# Patient Record
Sex: Male | Born: 1975 | Race: Black or African American | Hispanic: No | Marital: Single | Smoking: Current every day smoker
Health system: Southern US, Community
[De-identification: ages and names within clinical notes are randomized; demographics above are authoritative.]

## PROBLEM LIST (undated history)

## (undated) DIAGNOSIS — G4733 Obstructive sleep apnea (adult) (pediatric): Secondary | ICD-10-CM

## (undated) DIAGNOSIS — I5022 Chronic systolic (congestive) heart failure: Secondary | ICD-10-CM

## (undated) DIAGNOSIS — Z9289 Personal history of other medical treatment: Secondary | ICD-10-CM

## (undated) DIAGNOSIS — Z91199 Patient's noncompliance with other medical treatment and regimen due to unspecified reason: Secondary | ICD-10-CM

## (undated) DIAGNOSIS — Z9119 Patient's noncompliance with other medical treatment and regimen: Secondary | ICD-10-CM

## (undated) DIAGNOSIS — C8590 Non-Hodgkin lymphoma, unspecified, unspecified site: Secondary | ICD-10-CM

## (undated) DIAGNOSIS — I428 Other cardiomyopathies: Secondary | ICD-10-CM

## (undated) DIAGNOSIS — I10 Essential (primary) hypertension: Secondary | ICD-10-CM

## (undated) DIAGNOSIS — J45909 Unspecified asthma, uncomplicated: Secondary | ICD-10-CM

## (undated) HISTORY — DX: Chronic systolic (congestive) heart failure: I50.22

## (undated) HISTORY — DX: Other cardiomyopathies: I42.8

## (undated) HISTORY — DX: Morbid (severe) obesity due to excess calories: E66.01

## (undated) HISTORY — DX: Patient's noncompliance with other medical treatment and regimen: Z91.19

## (undated) HISTORY — DX: Personal history of other medical treatment: Z92.89

## (undated) HISTORY — DX: Patient's noncompliance with other medical treatment and regimen due to unspecified reason: Z91.199

## (undated) HISTORY — PX: PORT-A-CATH REMOVAL: SHX5289

---

## 2005-12-17 ENCOUNTER — Ambulatory Visit: Payer: Self-pay | Admitting: Internal Medicine

## 2005-12-27 ENCOUNTER — Ambulatory Visit (HOSPITAL_COMMUNITY): Admission: RE | Admit: 2005-12-27 | Discharge: 2005-12-27 | Payer: Self-pay | Admitting: Internal Medicine

## 2005-12-29 ENCOUNTER — Ambulatory Visit: Payer: Self-pay | Admitting: Internal Medicine

## 2005-12-29 LAB — CONVERTED CEMR LAB: RBC count: 5.13 10*6/uL

## 2005-12-31 ENCOUNTER — Ambulatory Visit (HOSPITAL_COMMUNITY): Admission: RE | Admit: 2005-12-31 | Discharge: 2005-12-31 | Payer: Self-pay | Admitting: Internal Medicine

## 2006-12-03 ENCOUNTER — Emergency Department (HOSPITAL_COMMUNITY): Admission: EM | Admit: 2006-12-03 | Discharge: 2006-12-04 | Payer: Self-pay | Admitting: Emergency Medicine

## 2006-12-15 ENCOUNTER — Encounter: Payer: Self-pay | Admitting: Internal Medicine

## 2006-12-15 DIAGNOSIS — I10 Essential (primary) hypertension: Secondary | ICD-10-CM

## 2006-12-15 DIAGNOSIS — Z8571 Personal history of Hodgkin lymphoma: Secondary | ICD-10-CM | POA: Insufficient documentation

## 2006-12-16 ENCOUNTER — Ambulatory Visit: Payer: Self-pay | Admitting: Internal Medicine

## 2006-12-16 DIAGNOSIS — R109 Unspecified abdominal pain: Secondary | ICD-10-CM | POA: Insufficient documentation

## 2006-12-16 DIAGNOSIS — J45901 Unspecified asthma with (acute) exacerbation: Secondary | ICD-10-CM

## 2006-12-16 DIAGNOSIS — R319 Hematuria, unspecified: Secondary | ICD-10-CM | POA: Insufficient documentation

## 2006-12-16 LAB — CONVERTED CEMR LAB
Bilirubin Urine: NEGATIVE
Glucose, Urine, Semiquant: NEGATIVE
Ketones, urine, test strip: NEGATIVE
Nitrite: NEGATIVE
Specific Gravity, Urine: >=1.03
pH: 6

## 2006-12-17 ENCOUNTER — Encounter (INDEPENDENT_AMBULATORY_CARE_PROVIDER_SITE_OTHER): Payer: Self-pay | Admitting: Internal Medicine

## 2011-03-10 ENCOUNTER — Emergency Department (HOSPITAL_COMMUNITY)
Admission: EM | Admit: 2011-03-10 | Discharge: 2011-03-10 | Disposition: A | Discharge status: Home / Self Care | Payer: 59 | Attending: Emergency Medicine | Admitting: Emergency Medicine

## 2011-03-10 DIAGNOSIS — T148XXA Other injury of unspecified body region, initial encounter: Secondary | ICD-10-CM | POA: Insufficient documentation

## 2011-03-10 DIAGNOSIS — X58XXXA Exposure to other specified factors, initial encounter: Secondary | ICD-10-CM | POA: Insufficient documentation

## 2011-03-10 DIAGNOSIS — I10 Essential (primary) hypertension: Secondary | ICD-10-CM | POA: Insufficient documentation

## 2011-03-10 DIAGNOSIS — Y9367 Activity, basketball: Secondary | ICD-10-CM | POA: Insufficient documentation

## 2011-08-13 ENCOUNTER — Emergency Department (HOSPITAL_COMMUNITY)
Admission: EM | Admit: 2011-08-13 | Discharge: 2011-08-14 | Disposition: A | Discharge status: Home / Self Care | Payer: 59 | Attending: Emergency Medicine | Admitting: Emergency Medicine

## 2011-08-13 DIAGNOSIS — I10 Essential (primary) hypertension: Secondary | ICD-10-CM | POA: Insufficient documentation

## 2011-08-13 DIAGNOSIS — G510 Bell's palsy: Secondary | ICD-10-CM | POA: Insufficient documentation

## 2011-08-14 ENCOUNTER — Emergency Department (HOSPITAL_COMMUNITY): Payer: 59

## 2011-08-14 LAB — BASIC METABOLIC PANEL: Calcium: 10.1 mg/dL (ref 8.4–10.5)

## 2013-03-15 ENCOUNTER — Inpatient Hospital Stay (HOSPITAL_COMMUNITY)
Admission: EM | Admit: 2013-03-15 | Discharge: 2013-03-20 | DRG: 544 | Disposition: A | Discharge status: Home / Self Care | Payer: BC Managed Care – PPO | Attending: Family Medicine | Admitting: Family Medicine

## 2013-03-15 ENCOUNTER — Emergency Department (HOSPITAL_COMMUNITY): Payer: BC Managed Care – PPO

## 2013-03-15 ENCOUNTER — Encounter (HOSPITAL_COMMUNITY): Payer: Self-pay | Admitting: Emergency Medicine

## 2013-03-15 DIAGNOSIS — J9600 Acute respiratory failure, unspecified whether with hypoxia or hypercapnia: Secondary | ICD-10-CM

## 2013-03-15 DIAGNOSIS — I5021 Acute systolic (congestive) heart failure: Secondary | ICD-10-CM

## 2013-03-15 DIAGNOSIS — I110 Hypertensive heart disease with heart failure: Principal | ICD-10-CM | POA: Diagnosis present

## 2013-03-15 DIAGNOSIS — N289 Disorder of kidney and ureter, unspecified: Secondary | ICD-10-CM | POA: Diagnosis present

## 2013-03-15 DIAGNOSIS — F17200 Nicotine dependence, unspecified, uncomplicated: Secondary | ICD-10-CM | POA: Diagnosis present

## 2013-03-15 DIAGNOSIS — I5031 Acute diastolic (congestive) heart failure: Secondary | ICD-10-CM

## 2013-03-15 DIAGNOSIS — J45901 Unspecified asthma with (acute) exacerbation: Secondary | ICD-10-CM

## 2013-03-15 DIAGNOSIS — I5043 Acute on chronic combined systolic (congestive) and diastolic (congestive) heart failure: Secondary | ICD-10-CM | POA: Diagnosis present

## 2013-03-15 DIAGNOSIS — G4733 Obstructive sleep apnea (adult) (pediatric): Secondary | ICD-10-CM | POA: Diagnosis present

## 2013-03-15 DIAGNOSIS — Z6841 Body Mass Index (BMI) 40.0 and over, adult: Secondary | ICD-10-CM

## 2013-03-15 DIAGNOSIS — R0902 Hypoxemia: Secondary | ICD-10-CM | POA: Diagnosis present

## 2013-03-15 DIAGNOSIS — I10 Essential (primary) hypertension: Secondary | ICD-10-CM | POA: Diagnosis present

## 2013-03-15 DIAGNOSIS — I509 Heart failure, unspecified: Secondary | ICD-10-CM | POA: Diagnosis present

## 2013-03-15 HISTORY — DX: Obstructive sleep apnea (adult) (pediatric): G47.33

## 2013-03-15 HISTORY — DX: Unspecified asthma, uncomplicated: J45.909

## 2013-03-15 HISTORY — DX: Essential (primary) hypertension: I10

## 2013-03-15 HISTORY — DX: Non-Hodgkin lymphoma, unspecified, unspecified site: C85.90

## 2013-03-15 LAB — CBC
Hemoglobin: 14.1 g/dL (ref 13.0–17.0)
MCH: 28.4 pg (ref 26.0–34.0)
MCV: 85.1 fL (ref 78.0–100.0)
Platelets: 294 10*3/uL (ref 150–400)
RDW: 14.1 % (ref 11.5–15.5)
WBC: 7.7 10*3/uL (ref 4.0–10.5)

## 2013-03-15 LAB — COMPREHENSIVE METABOLIC PANEL
AST: 20 U/L (ref 0–37)
BUN: 23 mg/dL (ref 6–23)
CO2: 29 mEq/L (ref 19–32)
Calcium: 9.5 mg/dL (ref 8.4–10.5)
Creatinine, Ser: 1.39 mg/dL — ABNORMAL HIGH (ref 0.50–1.35)
GFR calc Af Amer: 74 mL/min — ABNORMAL LOW (ref >90)
GFR calc non Af Amer: 64 mL/min — ABNORMAL LOW (ref >90)
Glucose, Bld: 96 mg/dL (ref 70–99)

## 2013-03-15 LAB — CBC WITH DIFFERENTIAL/PLATELET
Basophils Absolute: 0 10*3/uL (ref 0.0–0.1)
Lymphocytes Relative: 19 % (ref 12–46)
Neutro Abs: 6.3 10*3/uL (ref 1.7–7.7)
Platelets: 296 10*3/uL (ref 150–400)
RDW: 14.2 % (ref 11.5–15.5)
WBC: 8.7 10*3/uL (ref 4.0–10.5)

## 2013-03-15 LAB — PROTIME-INR: INR: 0.97 (ref 0.00–1.49)

## 2013-03-15 LAB — TSH: TSH: 2.122 u[IU]/mL (ref 0.350–4.500)

## 2013-03-15 LAB — POCT I-STAT TROPONIN I: Troponin i, poc: 0.02 ng/mL (ref 0.00–0.08)

## 2013-03-15 LAB — APTT: aPTT: 38 seconds — ABNORMAL HIGH (ref 24–37)

## 2013-03-15 LAB — BASIC METABOLIC PANEL
CO2: 24 mEq/L (ref 19–32)
Sodium: 142 mEq/L (ref 135–145)

## 2013-03-15 LAB — MAGNESIUM: Magnesium: 1.8 mg/dL (ref 1.5–2.5)

## 2013-03-15 LAB — PHOSPHORUS: Phosphorus: 3.9 mg/dL (ref 2.3–4.6)

## 2013-03-15 LAB — RAPID URINE DRUG SCREEN, HOSP PERFORMED
Amphetamines: NOT DETECTED
Benzodiazepines: NOT DETECTED
Opiates: NOT DETECTED
Tetrahydrocannabinol: POSITIVE — AB

## 2013-03-15 LAB — PRO B NATRIURETIC PEPTIDE: Pro B Natriuretic peptide (BNP): 3906 pg/mL — ABNORMAL HIGH (ref 0–125)

## 2013-03-15 LAB — ETHANOL: Alcohol, Ethyl (B): <11 mg/dL (ref 0–11)

## 2013-03-15 MED ORDER — MORPHINE SULFATE 2 MG/ML IJ SOLN: 1.0000 mg | INTRAMUSCULAR | Status: DC | PRN

## 2013-03-15 MED ORDER — ACETAMINOPHEN 650 MG RE SUPP: 650.0000 mg | Freq: Four times a day (QID) | RECTAL | Status: DC | PRN

## 2013-03-15 MED ORDER — ONDANSETRON HCL 4 MG PO TABS: 4.0000 mg | ORAL_TABLET | Freq: Four times a day (QID) | ORAL | Status: DC | PRN

## 2013-03-15 MED ORDER — ALBUTEROL SULFATE (5 MG/ML) 0.5% IN NEBU
2.5000 mg | INHALATION_SOLUTION | RESPIRATORY_TRACT | Status: DC
  Administered 2013-03-15 – 2013-03-16 (×5): 2.5 mg via RESPIRATORY_TRACT
  Filled 2013-03-15 (×6): qty 0.5

## 2013-03-15 MED ORDER — FUROSEMIDE 10 MG/ML IJ SOLN
40.0000 mg | Freq: Two times a day (BID) | INTRAMUSCULAR | Status: DC
  Administered 2013-03-15 – 2013-03-17 (×5): 40 mg via INTRAVENOUS
  Filled 2013-03-15 (×9): qty 4

## 2013-03-15 MED ORDER — SODIUM CHLORIDE 0.9 % IJ SOLN
3.0000 mL | Freq: Two times a day (BID) | INTRAMUSCULAR | Status: DC
  Administered 2013-03-15 – 2013-03-20 (×11): 3 mL via INTRAVENOUS

## 2013-03-15 MED ORDER — ACETAMINOPHEN 325 MG PO TABS: 650.0000 mg | ORAL_TABLET | Freq: Four times a day (QID) | ORAL | Status: DC | PRN

## 2013-03-15 MED ORDER — HYDROCODONE-ACETAMINOPHEN 5-325 MG PO TABS: 1.0000 | ORAL_TABLET | ORAL | Status: DC | PRN

## 2013-03-15 MED ORDER — ALBUTEROL SULFATE (5 MG/ML) 0.5% IN NEBU
2.5000 mg | INHALATION_SOLUTION | RESPIRATORY_TRACT | Status: DC | PRN
  Filled 2013-03-15: qty 0.5

## 2013-03-15 MED ORDER — FLUTICASONE PROPIONATE HFA 44 MCG/ACT IN AERO
1.0000 | INHALATION_SPRAY | Freq: Two times a day (BID) | RESPIRATORY_TRACT | Status: DC
  Administered 2013-03-15 – 2013-03-20 (×10): 1 via RESPIRATORY_TRACT
  Filled 2013-03-15: qty 10.6

## 2013-03-15 MED ORDER — METHYLPREDNISOLONE SODIUM SUCC 125 MG IJ SOLR
80.0000 mg | Freq: Four times a day (QID) | INTRAMUSCULAR | Status: DC
  Administered 2013-03-15 – 2013-03-16 (×4): 80 mg via INTRAVENOUS
  Filled 2013-03-15 (×7): qty 1.28

## 2013-03-15 MED ORDER — ONDANSETRON HCL 4 MG/2ML IJ SOLN: 4.0000 mg | Freq: Four times a day (QID) | INTRAMUSCULAR | Status: DC | PRN

## 2013-03-15 MED ORDER — FUROSEMIDE 10 MG/ML IJ SOLN
60.0000 mg | Freq: Once | INTRAMUSCULAR | Status: AC
  Administered 2013-03-15: 60 mg via INTRAVENOUS
  Filled 2013-03-15: qty 8

## 2013-03-15 MED ORDER — IPRATROPIUM BROMIDE 0.02 % IN SOLN
0.5000 mg | RESPIRATORY_TRACT | Status: DC | PRN
  Filled 2013-03-15: qty 2.5

## 2013-03-15 MED ORDER — AMLODIPINE BESYLATE 5 MG PO TABS
5.0000 mg | ORAL_TABLET | Freq: Every day | ORAL | Status: DC
  Administered 2013-03-15 – 2013-03-16 (×2): 5 mg via ORAL
  Filled 2013-03-15 (×2): qty 1

## 2013-03-15 MED ORDER — SODIUM CHLORIDE 0.9 % IV SOLN
Freq: Once | INTRAVENOUS | Status: AC
  Administered 2013-03-15: 13:00:00 via INTRAVENOUS

## 2013-03-15 MED ORDER — IPRATROPIUM BROMIDE 0.02 % IN SOLN
0.5000 mg | RESPIRATORY_TRACT | Status: DC
  Administered 2013-03-15 – 2013-03-16 (×5): 0.5 mg via RESPIRATORY_TRACT
  Filled 2013-03-15 (×6): qty 2.5

## 2013-03-15 MED ORDER — PNEUMOCOCCAL VAC POLYVALENT 25 MCG/0.5ML IJ INJ
0.5000 mL | INJECTION | Freq: Once | INTRAMUSCULAR | Status: DC
  Filled 2013-03-15: qty 0.5

## 2013-03-15 MED ORDER — ZOLPIDEM TARTRATE 5 MG PO TABS
5.0000 mg | ORAL_TABLET | Freq: Once | ORAL | Status: AC
  Administered 2013-03-15: 5 mg via ORAL
  Filled 2013-03-15: qty 1

## 2013-03-15 NOTE — ED Notes (Addendum)
Pt c/o generalized weakness, increased shortness of breath, and swelling in his lower extremities. Pt reports this has been increasing over the past two months, however did not obtain treatment due to lack of healthcare insurance. Pt reports that he has been unable to perform his usual activities and becomes short of breath while walking short distances, going up stairs, and playing golf. Pt reports that he normally can play a round of golf, play basketball, and other activities, however he has been too short of breath to even walk. Pt reports intermittent chest pain, however denies at this time. Pt is concerned about his history of lymphoma and the possibility of reoccurrence.

## 2013-03-15 NOTE — ED Notes (Signed)
ZOX:[id number]<AV> Expected date:<BR> Expected time:<BR> Means of arrival:<BR> Comments:<BR> Triage-fatigue

## 2013-03-15 NOTE — H&P (Signed)
[facility] History and Physical  Brian Hale HKV:[id number] DOB: 1976-06-22 DOA: 03/15/2013  Referring physician: ER physician PCP: METZ,CHRISTINE   Chief Complaint: shortness of breath   HPI:  37 year old male with past medical history of morbid obesity, hypertension and well-controlled asthma who presented to [facility] ED with worsening shortness of breath for past few days prior to this admission. Patient reported having shortness of breath especially on exertion for the past month and he thought this was asthma exacerbation. He did not try taking rescue inhalers and thought that his symptoms would just resolve spontaneously. Now he comes to ED for evaluation as he could not ambulate  even 10 feet without getting severe shortness of breath. Patient does report occasional cough but what he describes "coughing only to get more air in". No complaints of chest pain, palpitations. No fevers or chills. No abdominal pain, nausea or vomiting. No lightheadedness or loss of consciousness. In ED, patient was saturating 97% while on 2 L nasal cannula. His blood pressure was 138/104. Chest x-ray was significant for cardiomegaly. CBC and BMP were essentially unremarkable. BNP was elevated at 3906. No previous values for comparison. Patient was given Lasix 60 mg IV times once in ED but he remains short of breath.  Assessment and plan:  Principal Problem:   Acute respiratory failure with hypoxia - Likely secondary to acute asthma exacerbation versus acute diastolic CHF exacerbation - We have started nebulizer treatments, albuterol and Atrovent every 4 hours as scheduled and every 2 hours as needed - We started Solu-Medrol 80 mg every 6 hours IV - Started Lasix 40 mg IV twice a day for possible CHF. Followup 2-D echo; cycle cardiac enzymes Active Problems:   Asthma with acute exacerbation - Management as above with nebulizer treatments as well as steroids   Acute diastolic CHF (congestive heart  failure) - As mentioned above, BNP was elevated at 3906 and there is no other value for comparison. - Lasix 60 mg IV times once was given in ED. We will continue Lasix 40 mg IV twice a day and followup on 2D echo - Cycle cardiac enzymes   OBESITY, MORBID - Nutrition consulted   HYPERTENSION - Restarted home Norvasc. Continue to monitor blood pressure. Patient may need another antihypertensive if blood pressure does not improve in next 12 hours.  Code Status: Full Family Communication: Pt at bedside Disposition Plan: Admit for further evaluation  Manson Passey, MD  [facility] Pager [phone]  If 7PM-7AM, please contact night-coverage [email or web address] Password [email or web address] 03/15/2013, 4:08 PM  Review of Systems:  Constitutional: Negative for fever, chills and malaise/fatigue. Negative for diaphoresis.  HENT: Negative for hearing loss, ear pain, nosebleeds, congestion, sore throat, neck pain, tinnitus and ear discharge.   Eyes: Negative for blurred vision, double vision, photophobia, pain, discharge and redness.  Respiratory: Positive for shortness of breath, intermittent cough; positive for wheezing but no stridor   Cardiovascular: Negative for chest pain, palpitations, orthopnea, claudication and leg swelling.  Gastrointestinal: Negative for nausea, vomiting and abdominal pain. Negative for heartburn, constipation, blood in stool and melena.  Genitourinary: Negative for dysuria, urgency, frequency, hematuria and flank pain.  Musculoskeletal: Negative for myalgias, back pain, joint pain and falls.  Skin: Negative for itching and rash.  Neurological: Negative for dizziness and weakness. Negative for tingling, tremors, sensory change, speech change, focal weakness, loss of consciousness and headaches.  Endo/Heme/Allergies: Negative for environmental allergies and polydipsia. Does not bruise/bleed easily.  Psychiatric/Behavioral: Negative for suicidal ideas. The patient is not nervous/anxious.  Past  Medical History  Diagnosis Date  . Lymphoma in remission   . Asthma    Past Surgical History  Procedure Laterality Date  . Port-a-cath removal     Social History:  reports that he quit smoking about 3 weeks ago. His smoking use included Cigars. He has never used smokeless tobacco. He reports that he drinks about 21.6 ounces of alcohol per week. He reports that he does not use illicit drugs.  No Known Allergies  Family History: asthma in parents and son  Prior to Admission medications   Medication Sig Start Date End Date Taking? Authorizing Provider  amLODipine (NORVASC) 5 MG tablet Take 5 mg by mouth daily.   Yes Historical Provider, MD  beclomethasone (QVAR) 40 MCG/ACT inhaler Inhale 3 puffs into the lungs as needed (for shortness of breath).   Yes Historical Provider, MD   Physical Exam: Filed Vitals:   03/15/13 1333 03/15/13 1336 03/15/13 1430 03/15/13 1456  BP: 151/110 148/107 138/104 147/111  Pulse: 98 99  102  Temp:    97.8 F (36.6 C)  TempSrc:    Oral  Resp: 20 24 21 20   SpO2: 97% 98%  100%    Physical Exam  Constitutional: Appears well-developed and well-nourished. No distress.  HENT: Normocephalic. External right and left ear normal. Oropharynx is clear and moist.  Eyes: Conjunctivae and EOM are normal. PERRLA, no scleral icterus.  Neck: Normal ROM. Neck supple. No JVD. No tracheal deviation. No thyromegaly.  CVS: RRR, S1/S2 +, no murmurs, no gallops, no carotid bruit.  Pulmonary: Profound wheezing, audible. No crackles. Abdominal: Soft. BS +,  no distension, tenderness, rebound or guarding.  Musculoskeletal: Normal range of motion. No edema and no tenderness.  Lymphadenopathy: No lymphadenopathy noted, cervical, inguinal. Neuro: Alert. Normal reflexes, muscle tone coordination. No cranial nerve deficit. Skin: Skin is warm and dry. No rash noted. Not diaphoretic. No erythema. No pallor.  Psychiatric: Normal mood and affect. Behavior, judgment, thought content  normal.   Labs on Admission:  Basic Metabolic Panel:  Recent Labs Lab 03/15/13 1145  NA 142  K 3.8  CL 107  CO2 24  GLUCOSE 107*  BUN 23  CREATININE 1.33  CALCIUM 9.4   CBC:  Recent Labs Lab 03/15/13 1145 03/15/13 1552  WBC 7.7 8.7  NEUTROABS  --  6.3  HGB 14.1 14.2  HCT 42.2 43.5  MCV 85.1 85.0  PLT 294 296   Radiological Exams on Admission: Dg Chest 2 View 03/15/2013   * IMPRESSION: Cardiac enlargement with mild edema compatible with fluid overload.   Original Report Authenticated By: Janeece Riggers, M.D.    EKG: Normal sinus rhythm, no ST/T wave changes

## 2013-03-15 NOTE — ED Notes (Signed)
Patient returned from X-ray 

## 2013-03-15 NOTE — ED Provider Notes (Signed)
History     CSN: [id number]  Arrival date & time 03/15/13  1108   First MD Initiated Contact with Patient 03/15/13 1135      Chief Complaint  Patient presents with  . Shortness of Breath  . Fatigue    (Consider location/radiation/quality/duration/timing/severity/associated sxs/prior treatment) Patient is a 37 y.o. male presenting with shortness of breath. The history is provided by the patient.  Shortness of Breath  patient here complaining of a one-month history of orthopnea and dyspnea on exertion. Denies any anginal type chest pain. Some nonproductive cough. Increased sleep apnea at home. Denies any vomiting or diarrhea. Symptoms have been persistent and has been self-medicating with a diuretic which was not prescribed for him. He does have a history of hypertension has been taking his Norvasc as directed. Percent at this time due 2 worsening dyspnea.  Past Medical History  Diagnosis Date  . Lymphoma in remission   . Asthma     Past Surgical History  Procedure Laterality Date  . Port-a-cath removal      No family history on file.  History  Substance Use Topics  . Smoking status: Former Smoker -- 0.10 packs/day for 20 years    Types: Cigars    Quit date: 02/22/2013  . Smokeless tobacco: Never Used  . Alcohol Use: 21.6 oz/week    36 Cans of beer per week      Review of Systems  Respiratory: Positive for shortness of breath.   All other systems reviewed and are negative.    Allergies  Review of patient's allergies indicates no known allergies.  Home Medications   Current Outpatient Rx  Name  Route  Sig  Dispense  Refill  . amLODipine (NORVASC) 5 MG tablet   Oral   Take 5 mg by mouth daily.         . beclomethasone (QVAR) 40 MCG/ACT inhaler   Inhalation   Inhale 3 puffs into the lungs as needed (for shortness of breath).           BP 160/116  Pulse 107  Temp(Src) 97.7 F (36.5 C) (Oral)  Resp 22  SpO2 98%  Physical Exam  Nursing note and  vitals reviewed. Constitutional: He is oriented to person, place, and time. He appears well-developed and well-nourished.  Non-toxic appearance. No distress.  HENT:  Head: Normocephalic and atraumatic.  Eyes: Conjunctivae, EOM and lids are normal. Pupils are equal, round, and reactive to light.  Neck: Normal range of motion. Neck supple. No tracheal deviation present. No mass present.  Cardiovascular: Regular rhythm and normal heart sounds.  Tachycardia present.  Exam reveals no gallop.   No murmur heard. Pulmonary/Chest: Effort normal. No stridor. No respiratory distress. He has decreased breath sounds. He has no wheezes. He has rhonchi. He has no rales.  Abdominal: Soft. Normal appearance and bowel sounds are normal. He exhibits no distension. There is no tenderness. There is no rebound and no CVA tenderness.  Musculoskeletal: Normal range of motion. He exhibits no edema and no tenderness.  3+ bilateral lower extremity pitting edema  Neurological: He is alert and oriented to person, place, and time. He has normal strength. No cranial nerve deficit or sensory deficit. GCS eye subscore is 4. GCS verbal subscore is 5. GCS motor subscore is 6.  Skin: Skin is warm and dry. No abrasion and no rash noted.  Psychiatric: He has a normal mood and affect. His speech is normal and behavior is normal.    ED Course  Procedures (including critical care time)  Labs Reviewed  CBC  BASIC METABOLIC PANEL  PRO B NATRIURETIC PEPTIDE   No results found.   No diagnosis found.    MDM   Date: 03/15/2013  Rate: 107  Rhythm: sinus tachycardia  QRS Axis: normal  Intervals: normal  ST/T Wave abnormalities: nonspecific ST changes  Conduction Disutrbances:none  Narrative Interpretation:   Old EKG Reviewed: none available  1:54 PM Patient given Lasix IV push and will be admitted for evaluation of CHF        Toy Baker, MD 03/15/13 1355

## 2013-03-15 NOTE — ED Notes (Signed)
Patient transported to X-ray 

## 2013-03-16 DIAGNOSIS — I5031 Acute diastolic (congestive) heart failure: Secondary | ICD-10-CM

## 2013-03-16 DIAGNOSIS — I517 Cardiomegaly: Secondary | ICD-10-CM

## 2013-03-16 DIAGNOSIS — I509 Heart failure, unspecified: Secondary | ICD-10-CM

## 2013-03-16 DIAGNOSIS — J45901 Unspecified asthma with (acute) exacerbation: Secondary | ICD-10-CM

## 2013-03-16 DIAGNOSIS — J9600 Acute respiratory failure, unspecified whether with hypoxia or hypercapnia: Secondary | ICD-10-CM

## 2013-03-16 LAB — CBC
HCT: 45.2 % (ref 39.0–52.0)
Hemoglobin: 14.4 g/dL (ref 13.0–17.0)
MCHC: 31.9 g/dL (ref 30.0–36.0)
RBC: 5.25 MIL/uL (ref 4.22–5.81)

## 2013-03-16 LAB — COMPREHENSIVE METABOLIC PANEL
ALT: 32 U/L (ref 0–53)
Calcium: 10 mg/dL (ref 8.4–10.5)
Creatinine, Ser: 1.37 mg/dL — ABNORMAL HIGH (ref 0.50–1.35)
GFR calc Af Amer: 75 mL/min — ABNORMAL LOW (ref >90)
Glucose, Bld: 256 mg/dL — ABNORMAL HIGH (ref 70–99)
Sodium: 142 mEq/L (ref 135–145)
Total Protein: 6.8 g/dL (ref 6.0–8.3)

## 2013-03-16 LAB — BASIC METABOLIC PANEL
CO2: 30 mEq/L (ref 19–32)
Calcium: 10.7 mg/dL — ABNORMAL HIGH (ref 8.4–10.5)
Chloride: 98 mEq/L (ref 96–112)
Glucose, Bld: 203 mg/dL — ABNORMAL HIGH (ref 70–99)
Potassium: 4.3 mEq/L (ref 3.5–5.1)
Sodium: 139 mEq/L (ref 135–145)

## 2013-03-16 LAB — GLUCOSE, CAPILLARY: Glucose-Capillary: 144 mg/dL — ABNORMAL HIGH (ref 70–99)

## 2013-03-16 MED ORDER — ALBUTEROL SULFATE (5 MG/ML) 0.5% IN NEBU
2.5000 mg | INHALATION_SOLUTION | Freq: Four times a day (QID) | RESPIRATORY_TRACT | Status: DC
  Administered 2013-03-17 – 2013-03-20 (×12): 2.5 mg via RESPIRATORY_TRACT
  Filled 2013-03-16 (×11): qty 0.5

## 2013-03-16 MED ORDER — HYDROCHLOROTHIAZIDE 25 MG PO TABS
25.0000 mg | ORAL_TABLET | Freq: Every day | ORAL | Status: DC
  Administered 2013-03-16 – 2013-03-17 (×2): 25 mg via ORAL
  Filled 2013-03-16 (×2): qty 1

## 2013-03-16 MED ORDER — METHYLPREDNISOLONE SODIUM SUCC 125 MG IJ SOLR
60.0000 mg | Freq: Every day | INTRAMUSCULAR | Status: DC
  Administered 2013-03-17: 60 mg via INTRAVENOUS
  Filled 2013-03-16: qty 0.96

## 2013-03-16 MED ORDER — HYDRALAZINE HCL 20 MG/ML IJ SOLN
10.0000 mg | Freq: Four times a day (QID) | INTRAMUSCULAR | Status: DC | PRN
  Administered 2013-03-17: 10 mg via INTRAVENOUS
  Filled 2013-03-16: qty 1

## 2013-03-16 MED ORDER — AMLODIPINE BESYLATE 10 MG PO TABS
10.0000 mg | ORAL_TABLET | Freq: Every day | ORAL | Status: DC
  Administered 2013-03-16: 5 mg via ORAL
  Administered 2013-03-17 – 2013-03-20 (×4): 10 mg via ORAL
  Filled 2013-03-16 (×5): qty 1

## 2013-03-16 MED ORDER — AMLODIPINE BESYLATE 10 MG PO TABS: 10.0000 mg | ORAL_TABLET | Freq: Every day | ORAL | Status: DC

## 2013-03-16 MED ORDER — IPRATROPIUM BROMIDE 0.02 % IN SOLN
0.5000 mg | Freq: Four times a day (QID) | RESPIRATORY_TRACT | Status: DC
  Administered 2013-03-17 – 2013-03-20 (×12): 0.5 mg via RESPIRATORY_TRACT
  Filled 2013-03-16 (×10): qty 2.5

## 2013-03-16 MED ORDER — HYDROCHLOROTHIAZIDE 25 MG PO TABS
25.0000 mg | ORAL_TABLET | Freq: Every day | ORAL | Status: DC
  Filled 2013-03-16: qty 1

## 2013-03-16 NOTE — Progress Notes (Signed)
Utilization Review completed.  Amadi Frady RN CM  

## 2013-03-16 NOTE — Progress Notes (Signed)
  Echocardiogram 2D Echocardiogram has been performed.  Brian Hale 03/16/2013, 10:44 AM

## 2013-03-16 NOTE — Evaluation (Signed)
Physical Therapy Evaluation Patient Details Name: Brian Hale MRN: [id number] DOB: 1976-04-03 Today's Date: 03/16/2013 Time: 8413-2440 PT Time Calculation (min): 23 min  PT Assessment / Plan / Recommendation Clinical Impression  37 yo male admitted 03/15/13 for increased SOB, h/o asthma. Pt is independent with mobility. Ambulated in hall  x 300 ' with 2 l O2 sats remained Z> 90. amb. x 150 ; on RA  sats dropped to 86 eventually. Returned to 96% on 2 l and pursed lip breaths.RN aware.Pt does not require skilled PT at this time. Nursing can determined Home O2 needs. Pt can ambulate with family with O2 per RN ok.     PT Assessment  Patent does not need any further PT services    Follow Up Recommendations       Does the patient have the potential to tolerate intense rehabilitation      Barriers to Discharge        Equipment Recommendations       Recommendations for Other Services     Frequency      Precautions / Restrictions Precautions Precaution Comments: oxygen   Pertinent Vitals/Pain       Mobility  Bed Mobility Bed Mobility: Supine to Sit Supine to Sit: 7: Independent Transfers Transfers: Sit to Stand;Stand to Sit Sit to Stand: 7: Independent Stand to Sit: 7: Independent Ambulation/Gait Ambulation/Gait Assistance: 7: Independent Ambulation Distance (Feet): 400 Feet (O2 tank taken by PT) Assistive device: None    Exercises Other Exercises Other Exercises: pursed lip breaths.   PT Diagnosis:    PT Problem List:   PT Treatment Interventions:     PT Goals    Visit Information  Last PT Received On: 03/16/13 Assistance Needed: +1    Subjective Data  Subjective: Will i have to have oxygen when I work. Patient Stated Goal: to go home.   Prior Functioning  Home Living Lives With: Spouse Available Help at Discharge: Family Type of Home: House Home Layout: One level Home Adaptive Equipment: None Prior Function Level of Independence:  Independent Vocation: Full time employment Communication Communication: No difficulties    Cognition  Cognition Arousal/Alertness: Awake/alert Behavior During Therapy: WFL for tasks assessed/performed Overall Cognitive Status: Within Functional Limits for tasks assessed    Extremity/Trunk Assessment Right Upper Extremity Assessment RUE ROM/Strength/Tone: Within functional levels Left Upper Extremity Assessment LUE ROM/Strength/Tone: Within functional levels Right Lower Extremity Assessment RLE ROM/Strength/Tone: Within functional levels Left Lower Extremity Assessment LLE ROM/Strength/Tone: Within functional levels   Balance    End of Session PT - End of Session Activity Tolerance: Patient tolerated treatment well Patient left: with family/visitor present Nurse Communication: Mobility status (sats)  GP     Rada Hay 03/16/2013, 2:28 PM  Blanchard Kelch PT [phone]

## 2013-03-16 NOTE — Progress Notes (Signed)
Pt stated he would like med to help sleep. Midlevel paged awaiting orders.

## 2013-03-16 NOTE — Progress Notes (Signed)
  RD consulted for nutrition education regarding weight loss.  Body mass index is 42.96 kg/(m^2). Pt meets criteria for Morbid Obesity based on current BMI.  RD provided "Weight Loss Tips" handout from the Academy of [employer] Dietetics and "MyPlate" poster from [employer]. Emphasized the importance of serving sizes and provided examples of correct portions of common foods. Discussed importance of controlled and consistent intake throughout the day. Provided examples of ways to balance meals/snacks and encouraged intake of high-fiber, whole grain complex carbohydrates. Emphasized the importance of hydration with calorie-free beverages and limiting sugar-sweetened beverages. Encouraged pt to discuss physical activity options with physician. Teach back method used. Pt reports planning on eating out less, eating more fish and vegetables, decreasing intake of juice and sweet tea, and increasing physical activity.  Expect good compliance.  Current diet order is Heart Healthy, patient is consuming approximately 100% of meals at this time. Labs and medications reviewed. No further nutrition interventions warranted at this time. RD contact information provided. If additional nutrition issues arise, please re-consult RD.  Ian Malkin RD, LDN Inpatient Clinical Dietitian Pager: [phone] After Hours Pager: [phone]

## 2013-03-16 NOTE — Progress Notes (Signed)
[facility] PROGRESS NOTE  Brian Hale JYN:[id number] DOB: September 02, 1976 DOA: 03/15/2013 PCP: METZ,CHRISTINE  Assessment/Plan: Acute respiratory failure with hypoxia  - Likely secondary to acute asthma exacerbation versus acute diastolic CHF exacerbation (echo pending) - We have started nebulizer treatments, albuterol and Atrovent every 4 hours as scheduled and every 2 hours as needed. Patient reports history of asthma as child. - Will change solumedrol to 60 mg IV q day - Started Lasix 40 mg IV twice a day for possible CHF. Followup 2-D echo; cardiac enzymes x 3 negative  Active Problems:  Asthma with acute exacerbation  - Continue management as above with nebulizer treatments as well as steroids  Suspect Acute diastolic CHF (congestive heart failure)  - BNP was elevated at 3906 and there is no other value for comparison.  - Continue Lasix 40 mg IV twice a day and followup on 2D echo  - Cycle cardiac enzymes  - Continue to monitor I/O's net loss of > 1 L  OBESITY, MORBID  - Nutrition consulted and they have educated patient.  HYPERTENSION  - Not well controlled - placed order for hydralazine prn - increased norvasc to 10 mg po qd, also added hctz - recheck bmp next am.   Code Status: full Family Communication: Discussed with patient and spouse at bedside. Disposition Plan: Pending results of echocardiogram and continued improvement in respiratory rate.  Consultants:  None  Procedures:  2 D echocardiogram  Antibiotics:  None  HPI/Subjective: No acute issues overnight. No new complaints.   Objective: Filed Vitals:   03/16/13 0500 03/16/13 0542 03/16/13 1019 03/16/13 1353  BP:  172/95 179/110 191/113  Pulse:  88  106  Temp:  98 F (36.7 C)  98.5 F (36.9 C)  TempSrc:  Oral  Oral  Resp:  18  18  Height:      Weight: 143.8 kg (317 lb 0.3 oz) 143.7 kg (316 lb 12.8 oz)    SpO2:  96%  92%    Intake/Output Summary (Last 24 hours) at 03/16/13 1407 Last  data filed at 03/16/13 1354  Gross per 24 hour  Intake    840 ml  Output   1775 ml  Net   -935 ml   Filed Weights   03/15/13 1840 03/16/13 0500 03/16/13 0542  Weight: 141.432 kg (311 lb 12.8 oz) 143.8 kg (317 lb 0.3 oz) 143.7 kg (316 lb 12.8 oz)    Exam:   General:  Pt in NAD, Alert and Awake  Cardiovascular: RRR, No MRG  Respiratory: No wheezes, decreased breath sound at Right base when compared to left  Abdomen: soft, Nt, ND  Musculoskeletal: no cyanosis or clubbing   Data Reviewed: Basic Metabolic Panel:  Recent Labs Lab 03/15/13 1145 03/15/13 1552 03/16/13 0321  NA 142 143 142  K 3.8 3.8 3.9  CL 107 104 101  CO2 24 29 30   GLUCOSE 107* 96 256*  BUN 23 23 25*  CREATININE 1.33 1.39* 1.37*  CALCIUM 9.4 9.5 10.0  MG  --  1.8  --   PHOS  --  3.9  --    Liver Function Tests:  Recent Labs Lab 03/15/13 1552 03/16/13 0321  AST 20 19  ALT 35 32  ALKPHOS 97 103  BILITOT 0.7 0.6  PROT 6.7 6.8  ALBUMIN 3.7 3.5   No results found for this basename: LIPASE, AMYLASE,  in the last 168 hours No results found for this basename: AMMONIA,  in the last 168 hours CBC:  Recent Labs Lab 03/15/13 1145 03/15/13 1552 03/16/13 0321  WBC 7.7 8.7 9.3  NEUTROABS  --  6.3  --   HGB 14.1 14.2 14.4  HCT 42.2 43.5 45.2  MCV 85.1 85.0 86.1  PLT 294 296 317   Cardiac Enzymes:  Recent Labs Lab 03/15/13 1552 03/15/13 2114 03/16/13 0321  TROPONINI <0.30 <0.30 <0.30   BNP (last 3 results)  Recent Labs  03/15/13 1140  PROBNP 3906.0*   CBG:  Recent Labs Lab 03/16/13 0747  GLUCAP 144*    No results found for this or any previous visit (from the past 240 hour(s)).   Studies: Dg Chest 2 View  03/15/2013   *RADIOLOGY REPORT*  Clinical Data: Chest pain and short of breath.  History of lymphoma  CHEST - 2 VIEW  Comparison: 12/03/2006  Findings: Cardiac enlargement.  Pulmonary vascular congestion with interstitial edema.  No significant effusion.  No mass or  adenopathy.  Right lower lobe airspace disease most consistent with atelectasis or infiltrate.  IMPRESSION: Cardiac enlargement with mild edema compatible with fluid overload.   Original Report Authenticated By: Janeece Riggers, M.D.    Scheduled Meds: . albuterol  2.5 mg Nebulization Q4H  . amLODipine  10 mg Oral Daily  . fluticasone  1 puff Inhalation BID  . furosemide  40 mg Intravenous BID  . hydrochlorothiazide  25 mg Oral Daily  . ipratropium  0.5 mg Nebulization Q4H  . methylPREDNISolone (SOLU-MEDROL) injection  80 mg Intravenous Q6H  . pneumococcal 23 valent vaccine  0.5 mL Intramuscular Once  . sodium chloride  3 mL Intravenous Q12H   Continuous Infusions:   Principal Problem:   Acute respiratory failure Active Problems:   OBESITY, MORBID   HYPERTENSION   Asthma with acute exacerbation   Acute diastolic CHF (congestive heart failure)    Time spent: > 35 minutes    [name]  [facility] Pager [phone] If 7PM-7AM, please contact night-coverage at [email or web address], password [email or web address] 03/16/2013, 2:07 PM  LOS: 1 day

## 2013-03-17 ENCOUNTER — Encounter (HOSPITAL_COMMUNITY): Payer: Self-pay | Admitting: Cardiology

## 2013-03-17 DIAGNOSIS — I5021 Acute systolic (congestive) heart failure: Secondary | ICD-10-CM

## 2013-03-17 DIAGNOSIS — I10 Essential (primary) hypertension: Secondary | ICD-10-CM

## 2013-03-17 LAB — BASIC METABOLIC PANEL WITH GFR
BUN: 29 mg/dL — ABNORMAL HIGH (ref 6–23)
CO2: 33 meq/L — ABNORMAL HIGH (ref 19–32)
Calcium: 10.3 mg/dL (ref 8.4–10.5)
Chloride: 98 meq/L (ref 96–112)
Creatinine, Ser: 1.4 mg/dL — ABNORMAL HIGH (ref 0.50–1.35)
GFR calc Af Amer: 73 mL/min — ABNORMAL LOW
GFR calc non Af Amer: 63 mL/min — ABNORMAL LOW
Glucose, Bld: 247 mg/dL — ABNORMAL HIGH (ref 70–99)
Potassium: 3.8 meq/L (ref 3.5–5.1)
Sodium: 140 meq/L (ref 135–145)

## 2013-03-17 LAB — GLUCOSE, CAPILLARY: Glucose-Capillary: 151 mg/dL — ABNORMAL HIGH (ref 70–99)

## 2013-03-17 MED ORDER — LISINOPRIL 5 MG PO TABS
5.0000 mg | ORAL_TABLET | Freq: Every day | ORAL | Status: DC
  Administered 2013-03-17: 5 mg via ORAL
  Filled 2013-03-17 (×3): qty 1

## 2013-03-17 MED ORDER — CARVEDILOL 3.125 MG PO TABS
3.1250 mg | ORAL_TABLET | Freq: Two times a day (BID) | ORAL | Status: DC
  Administered 2013-03-17: 3.125 mg via ORAL
  Filled 2013-03-17 (×5): qty 1

## 2013-03-17 MED ORDER — HYDROCHLOROTHIAZIDE 50 MG PO TABS: 50.0000 mg | ORAL_TABLET | Freq: Every day | ORAL | Status: DC

## 2013-03-17 NOTE — Progress Notes (Signed)
Pt has brought home CPAP in, BIOMed not available to inspect, RT inspected Cords and machine and everything is in working order, no frays or naked wires noted.  Pt is comfortable placing himself on his own CPAP. RT to monitor and assess as needed.

## 2013-03-17 NOTE — Progress Notes (Signed)
[facility] PROGRESS NOTE  Brian Hale:[id number] DOB: June 20, 1976 DOA: 03/15/2013 PCP: METZ,CHRISTINE  Assessment/Plan: Acute respiratory failure with hypoxia / Acute systolic HF - Most likely at this point due to Echocardiogram results due to new diagnosis of Systolic congestive heart failure. - We have started nebulizer treatments, albuterol and Atrovent.  Patient reports history of asthma as child. - Will discontinue solumedrol given no wheezes on physical exam and new echocardiogram results. - Started Lasix 40 mg IV twice a day will continue unless otherwise specified by cardiology. - cardiac enzymes x 3 negative - BNP was elevated at 3906 and there is no other value for comparison.  - Cardiac enzymes negative - Continue to monitor I/O's  - Avoiding ace I / ARB due to renal insufficiency  Active Problems:  Asthma - Continue current therapy with bronchodilators.  Discontinue solumedrol as indicated above.  OBESITY, MORBID  - Nutrition consulted and they have educated patient.  HYPERTENSION  - Not well controlled - placed order for hydralazine prn - increased norvasc to 10 mg po qd, also added hctz. His last recorded blood pressure was 146/91. Will increase hctz dose today 5/17 - recheck bmp next am.   Code Status: full Family Communication: Discussed with patient and spouse at bedside. Disposition Plan: Pending results of echocardiogram and continued improvement in respiratory rate.  Consultants:  None  Procedures:  2 D echocardiogram  Antibiotics:  None  HPI/Subjective: No acute issues overnight. No new complaints.   Objective: Filed Vitals:   03/16/13 1353 03/16/13 2300 03/17/13 0656 03/17/13 0804  BP: 191/113 146/100 146/91   Pulse: 106 110 104   Temp: 98.5 F (36.9 C) 97.9 F (36.6 C) 97.7 F (36.5 C)   TempSrc: Oral Oral Oral   Resp: 18 20 18    Height:      Weight:   141.1 kg (311 lb 1.1 oz)   SpO2: 92% 97% 99% 95%    Intake/Output  Summary (Last 24 hours) at 03/17/13 1300 Last data filed at 03/17/13 1017  Gross per 24 hour  Intake    960 ml  Output   1050 ml  Net    -90 ml   Filed Weights   03/16/13 0500 03/16/13 0542 03/17/13 0656  Weight: 143.8 kg (317 lb 0.3 oz) 143.7 kg (316 lb 12.8 oz) 141.1 kg (311 lb 1.1 oz)    Exam:   General:  Pt in NAD, Alert and Awake  Cardiovascular: RRR, No MRG  Respiratory: No wheezes, decreased breath sound at Right base when compared to left  Abdomen: soft, Nt, ND  Musculoskeletal: no cyanosis or clubbing   Data Reviewed: Basic Metabolic Panel:  Recent Labs Lab 03/15/13 1145 03/15/13 1552 03/16/13 0321 03/16/13 1407 03/17/13 0550  NA 142 143 142 139 140  K 3.8 3.8 3.9 4.3 3.8  CL 107 104 101 98 98  CO2 24 29 30 30  33*  GLUCOSE 107* 96 256* 203* 247*  BUN 23 23 25* 21 29*  CREATININE 1.33 1.39* 1.37* 1.15 1.40*  CALCIUM 9.4 9.5 10.0 10.7* 10.3  MG  --  1.8  --   --   --   PHOS  --  3.9  --   --   --    Liver Function Tests:  Recent Labs Lab 03/15/13 1552 03/16/13 0321  AST 20 19  ALT 35 32  ALKPHOS 97 103  BILITOT 0.7 0.6  PROT 6.7 6.8  ALBUMIN 3.7 3.5   No results found for this  basename: LIPASE, AMYLASE,  in the last 168 hours No results found for this basename: AMMONIA,  in the last 168 hours CBC:  Recent Labs Lab 03/15/13 1145 03/15/13 1552 03/16/13 0321  WBC 7.7 8.7 9.3  NEUTROABS  --  6.3  --   HGB 14.1 14.2 14.4  HCT 42.2 43.5 45.2  MCV 85.1 85.0 86.1  PLT 294 296 317   Cardiac Enzymes:  Recent Labs Lab 03/15/13 1552 03/15/13 2114 03/16/13 0321  TROPONINI <0.30 <0.30 <0.30   BNP (last 3 results)  Recent Labs  03/15/13 1140  PROBNP 3906.0*   CBG:  Recent Labs Lab 03/16/13 0747 03/17/13 0754  GLUCAP 144* 151*    No results found for this or any previous visit (from the past 240 hour(s)).   Studies: No results found.  Scheduled Meds: . albuterol  2.5 mg Nebulization QID  . amLODipine  10 mg Oral Daily   . fluticasone  1 puff Inhalation BID  . furosemide  40 mg Intravenous BID  . hydrochlorothiazide  25 mg Oral Daily  . ipratropium  0.5 mg Nebulization QID  . methylPREDNISolone (SOLU-MEDROL) injection  60 mg Intravenous Daily  . pneumococcal 23 valent vaccine  0.5 mL Intramuscular Once  . sodium chloride  3 mL Intravenous Q12H   Continuous Infusions:   Principal Problem:   Acute respiratory failure Active Problems:   OBESITY, MORBID   HYPERTENSION   Asthma with acute exacerbation   Acute diastolic CHF (congestive heart failure)    Time spent: > 35 minutes    [name]  [facility] Pager [phone] If 7PM-7AM, please contact night-coverage at [email or web address], password [email or web address] 03/17/2013, 1:00 PM  LOS: 2 days

## 2013-03-17 NOTE — Consult Note (Signed)
HPI: 37 year old male with no prior cardiac history for evaluation of congestive heart failure. Patient states that for the past 3 months he has had progressive dyspnea on exertion, orthopnea, PND and pedal edema. No chest pain, productive cough or fevers or chills. He was admitted with those complaints on May 15 and cardiology is asked to evaluate.  Medications Prior to Admission  Medication Sig Dispense Refill  . amLODipine (NORVASC) 5 MG tablet Take 5 mg by mouth daily.      . beclomethasone (QVAR) 40 MCG/ACT inhaler Inhale 3 puffs into the lungs as needed (for shortness of breath).        No Known Allergies  Past Medical History  Diagnosis Date  . Lymphoma in remission   . Asthma   . Hypertension   . OSA (obstructive sleep apnea)     Past Surgical History  Procedure Laterality Date  . Port-a-cath removal      History   Social History  . Marital Status: Single    Spouse Name: N/A    Number of Children: N/A  . Years of Education: N/A   Occupational History  . Not on file.   Social History Main Topics  . Smoking status: Former Smoker -- 0.10 packs/day for 20 years    Types: Cigars    Quit date: 02/22/2013  . Smokeless tobacco: Never Used  . Alcohol Use: 21.6 oz/week    36 Cans of beer per week  . Drug Use: No  . Sexually Active: No   Other Topics Concern  . Not on file   Social History Narrative  . No narrative on file    Family History  Problem Relation Age of Onset  . Heart disease      No family history    ROS:  no fevers or chills, productive cough, hemoptysis, dysphasia, odynophagia, melena, hematochezia, dysuria, hematuria, rash, seizure activity, orthopnea, PND, claudication. Remaining systems are negative.  Physical Exam:   Blood pressure 168/98, pulse 108, temperature 98.2 F (36.8 C), temperature source Oral, resp. rate 22, height 6' (1.829 m), weight 311 lb 1.1 oz (141.1 kg), SpO2 94.00%.  General:  Well developed/obese in NAD Skin  warm/dry Patient not depressed No peripheral clubbing Back-normal HEENT-normal/normal eyelids Neck supple/normal carotid upstroke bilaterally; no bruits; no JVD; no thyromegaly chest - CTA/ normal expansion CV - RRR/normal S1 and S2; no murmurs, rubs;  PMI nondisplaced, positive gallop Abdomen -NT/ND, no HSM, no mass, + bowel sounds, no bruit 2+ femoral pulses, no bruits Ext-trace edema, no chords, 2+ DP Neuro-grossly nonfocal  ECG sinus rhythm, right axis deviation, no ST changes.  Results for orders placed during the hospital encounter of 03/15/13 (from the past 48 hour(s))  COMPREHENSIVE METABOLIC PANEL     Status: Abnormal   Collection Time    03/15/13  3:52 PM      Result Value Range   Sodium 143  135 - 145 mEq/L   Potassium 3.8  3.5 - 5.1 mEq/L   Chloride 104  96 - 112 mEq/L   CO2 29  19 - 32 mEq/L   Glucose, Bld 96  70 - 99 mg/dL   BUN 23  6 - 23 mg/dL   Creatinine, Ser 1.61 (*) 0.50 - 1.35 mg/dL   Calcium 9.5  8.4 - 09.6 mg/dL   Total Protein 6.7  6.0 - 8.3 g/dL   Albumin 3.7  3.5 - 5.2 g/dL   AST 20  0 - 37 U/L   ALT 35  0 - 53 U/L   Alkaline Phosphatase 97  39 - 117 U/L   Total Bilirubin 0.7  0.3 - 1.2 mg/dL   GFR calc non Af Amer 64 (*) >90 mL/min   GFR calc Af Amer 74 (*) >90 mL/min   Comment:            The eGFR has been calculated     using the CKD EPI equation.     This calculation has not been     validated in all clinical     situations.     eGFR's persistently     <90 mL/min signify     possible Chronic Kidney Disease.  MAGNESIUM     Status: None   Collection Time    03/15/13  3:52 PM      Result Value Range   Magnesium 1.8  1.5 - 2.5 mg/dL  PHOSPHORUS     Status: None   Collection Time    03/15/13  3:52 PM      Result Value Range   Phosphorus 3.9  2.3 - 4.6 mg/dL  CBC WITH DIFFERENTIAL     Status: None   Collection Time    03/15/13  3:52 PM      Result Value Range   WBC 8.7  4.0 - 10.5 K/uL   RBC 5.12  4.22 - 5.81 MIL/uL   Hemoglobin  14.2  13.0 - 17.0 g/dL   HCT 16.1  09.6 - 04.5 %   MCV 85.0  78.0 - 100.0 fL   MCH 27.7  26.0 - 34.0 pg   MCHC 32.6  30.0 - 36.0 g/dL   RDW 40.9  81.1 - 91.4 %   Platelets 296  150 - 400 K/uL   Neutrophils Relative % 72  43 - 77 %   Neutro Abs 6.3  1.7 - 7.7 K/uL   Lymphocytes Relative 19  12 - 46 %   Lymphs Abs 1.7  0.7 - 4.0 K/uL   Monocytes Relative 7  3 - 12 %   Monocytes Absolute 0.6  0.1 - 1.0 K/uL   Eosinophils Relative 2  0 - 5 %   Eosinophils Absolute 0.2  0.0 - 0.7 K/uL   Basophils Relative 0  0 - 1 %   Basophils Absolute 0.0  0.0 - 0.1 K/uL  APTT     Status: Abnormal   Collection Time    03/15/13  3:52 PM      Result Value Range   aPTT 38 (*) 24 - 37 seconds   Comment:            IF BASELINE aPTT IS ELEVATED,     SUGGEST PATIENT RISK ASSESSMENT     BE USED TO DETERMINE APPROPRIATE     ANTICOAGULANT THERAPY.  PROTIME-INR     Status: None   Collection Time    03/15/13  3:52 PM      Result Value Range   Prothrombin Time 12.8  11.6 - 15.2 seconds   INR 0.97  0.00 - 1.49  TSH     Status: None   Collection Time    03/15/13  3:52 PM      Result Value Range   TSH 2.122  0.350 - 4.500 uIU/mL  TROPONIN I     Status: None   Collection Time    03/15/13  3:52 PM      Result Value Range   Troponin I <0.30  <0.30 ng/mL   Comment:  Due to the release kinetics of cTnI,     a negative result within the first hours     of the onset of symptoms does not rule out     myocardial infarction with certainty.     If myocardial infarction is still suspected,     repeat the test at appropriate intervals.  ETHANOL     Status: None   Collection Time    03/15/13  4:34 PM      Result Value Range   Alcohol, Ethyl (B) <11  0 - 11 mg/dL   Comment:            LOWEST DETECTABLE LIMIT FOR     SERUM ALCOHOL IS 11 mg/dL     FOR MEDICAL PURPOSES ONLY  URINE RAPID DRUG SCREEN (HOSP PERFORMED)     Status: Abnormal   Collection Time    03/15/13  8:48 PM      Result Value Range     Opiates NONE DETECTED  NONE DETECTED   Cocaine NONE DETECTED  NONE DETECTED   Benzodiazepines NONE DETECTED  NONE DETECTED   Amphetamines NONE DETECTED  NONE DETECTED   Tetrahydrocannabinol POSITIVE (*) NONE DETECTED   Barbiturates NONE DETECTED  NONE DETECTED   Comment:            DRUG SCREEN FOR MEDICAL PURPOSES     ONLY.  IF CONFIRMATION IS NEEDED     FOR ANY PURPOSE, NOTIFY LAB     WITHIN 5 DAYS.                LOWEST DETECTABLE LIMITS     FOR URINE DRUG SCREEN     Drug Class       Cutoff (ng/mL)     Amphetamine      1000     Barbiturate      200     Benzodiazepine   200     Tricyclics       300     Opiates          300     Cocaine          300     THC              50  TROPONIN I     Status: None   Collection Time    03/15/13  9:14 PM      Result Value Range   Troponin I <0.30  <0.30 ng/mL   Comment:            Due to the release kinetics of cTnI,     a negative result within the first hours     of the onset of symptoms does not rule out     myocardial infarction with certainty.     If myocardial infarction is still suspected,     repeat the test at appropriate intervals.  TROPONIN I     Status: None   Collection Time    03/16/13  3:21 AM      Result Value Range   Troponin I <0.30  <0.30 ng/mL   Comment:            Due to the release kinetics of cTnI,     a negative result within the first hours     of the onset of symptoms does not rule out     myocardial infarction with certainty.     If myocardial infarction is still suspected,  repeat the test at appropriate intervals.  COMPREHENSIVE METABOLIC PANEL     Status: Abnormal   Collection Time    03/16/13  3:21 AM      Result Value Range   Sodium 142  135 - 145 mEq/L   Potassium 3.9  3.5 - 5.1 mEq/L   Chloride 101  96 - 112 mEq/L   CO2 30  19 - 32 mEq/L   Glucose, Bld 256 (*) 70 - 99 mg/dL   BUN 25 (*) 6 - 23 mg/dL   Creatinine, Ser 1.61 (*) 0.50 - 1.35 mg/dL   Calcium 09.6  8.4 - 04.5 mg/dL   Total  Protein 6.8  6.0 - 8.3 g/dL   Albumin 3.5  3.5 - 5.2 g/dL   AST 19  0 - 37 U/L   ALT 32  0 - 53 U/L   Alkaline Phosphatase 103  39 - 117 U/L   Total Bilirubin 0.6  0.3 - 1.2 mg/dL   GFR calc non Af Amer 65 (*) >90 mL/min   GFR calc Af Amer 75 (*) >90 mL/min   Comment:            The eGFR has been calculated     using the CKD EPI equation.     This calculation has not been     validated in all clinical     situations.     eGFR's persistently     <90 mL/min signify     possible Chronic Kidney Disease.  CBC     Status: None   Collection Time    03/16/13  3:21 AM      Result Value Range   WBC 9.3  4.0 - 10.5 K/uL   RBC 5.25  4.22 - 5.81 MIL/uL   Hemoglobin 14.4  13.0 - 17.0 g/dL   HCT 40.9  81.1 - 91.4 %   MCV 86.1  78.0 - 100.0 fL   MCH 27.4  26.0 - 34.0 pg   MCHC 31.9  30.0 - 36.0 g/dL   RDW 78.2  95.6 - 21.3 %   Platelets 317  150 - 400 K/uL  GLUCOSE, CAPILLARY     Status: Abnormal   Collection Time    03/16/13  7:47 AM      Result Value Range   Glucose-Capillary 144 (*) 70 - 99 mg/dL  BASIC METABOLIC PANEL     Status: Abnormal   Collection Time    03/16/13  2:07 PM      Result Value Range   Sodium 139  135 - 145 mEq/L   Potassium 4.3  3.5 - 5.1 mEq/L   Chloride 98  96 - 112 mEq/L   CO2 30  19 - 32 mEq/L   Glucose, Bld 203 (*) 70 - 99 mg/dL   BUN 21  6 - 23 mg/dL   Creatinine, Ser 0.86  0.50 - 1.35 mg/dL   Calcium 57.8 (*) 8.4 - 10.5 mg/dL   GFR calc non Af Amer 80 (*) >90 mL/min   GFR calc Af Amer >90  >90 mL/min   Comment:            The eGFR has been calculated     using the CKD EPI equation.     This calculation has not been     validated in all clinical     situations.     eGFR's persistently     <90 mL/min signify     possible Chronic Kidney Disease.  BASIC METABOLIC PANEL     Status: Abnormal   Collection Time    03/17/13  5:50 AM      Result Value Range   Sodium 140  135 - 145 mEq/L   Potassium 3.8  3.5 - 5.1 mEq/L   Chloride 98  96 - 112 mEq/L     CO2 33 (*) 19 - 32 mEq/L   Glucose, Bld 247 (*) 70 - 99 mg/dL   BUN 29 (*) 6 - 23 mg/dL   Creatinine, Ser 1.61 (*) 0.50 - 1.35 mg/dL   Calcium 09.6  8.4 - 04.5 mg/dL   GFR calc non Af Amer 63 (*) >90 mL/min   GFR calc Af Amer 73 (*) >90 mL/min   Comment:            The eGFR has been calculated     using the CKD EPI equation.     This calculation has not been     validated in all clinical     situations.     eGFR's persistently     <90 mL/min signify     possible Chronic Kidney Disease.  GLUCOSE, CAPILLARY     Status: Abnormal   Collection Time    03/17/13  7:54 AM      Result Value Range   Glucose-Capillary 151 (*) 70 - 99 mg/dL   Comment 1 Notify RN     Comment 2 Documented in Chart      No results found.  Assessment/Plan 1 acute systolic congestive heart failure-the patient's echocardiogram shows an ejection fraction of 30-35%. His history is consistent with congestive heart failure. Most likely etiology is hypertension that is uncontrolled as he has been noncompliant with medications. Less likely includes coronary disease or cardiotoxic chemotherapy in the past for his lymphoma (I do not have records of the agents used). I will add lisinopril 5 mg by mouth daily and carvedilol 3.125 mg by mouth twice a day. We will titrate these medications as needed for blood pressure control. Agree with diuresis. He has renal insufficiency and we will need to follow his BUN and creatinine closely. He will ultimately need a stress Myoview to screen for coronary disease which could be done as an outpatient. I think coronary disease is unlikely. 2 hypertension-I will continue Norvasc for now. We are adding lisinopril and carvedilol and we will titrate. If his blood pressure is not controlled with these medications we will add hydralazine/nitrate. 3 renal failure-the patient is noted to have a mildly elevated creatinine. Follow renal function closely with diuresis and addition of ACE inhibitor. 4  tobacco abuse-patient counseled on discontinuing. 5 obstructive sleep apnea-he needs to be compliant with CPAP.  Olga Millers MD 03/17/2013, 3:11 PM

## 2013-03-17 NOTE — Progress Notes (Signed)
Report received from [name],RN. No change in assessment. Will continue plan of care.Brian Hale 

## 2013-03-18 LAB — GLUCOSE, CAPILLARY

## 2013-03-18 MED ORDER — LISINOPRIL 10 MG PO TABS
10.0000 mg | ORAL_TABLET | Freq: Every day | ORAL | Status: DC
  Administered 2013-03-18 – 2013-03-20 (×3): 10 mg via ORAL
  Filled 2013-03-18 (×3): qty 1

## 2013-03-18 MED ORDER — CARVEDILOL 6.25 MG PO TABS
6.2500 mg | ORAL_TABLET | Freq: Two times a day (BID) | ORAL | Status: DC
  Administered 2013-03-18 (×2): 6.25 mg via ORAL
  Filled 2013-03-18 (×5): qty 1

## 2013-03-18 MED ORDER — FUROSEMIDE 20 MG PO TABS
20.0000 mg | ORAL_TABLET | Freq: Every day | ORAL | Status: DC
  Administered 2013-03-18 – 2013-03-20 (×3): 20 mg via ORAL
  Filled 2013-03-18 (×3): qty 1

## 2013-03-18 MED ORDER — ASPIRIN 81 MG PO CHEW
81.0000 mg | CHEWABLE_TABLET | Freq: Every day | ORAL | Status: DC
  Administered 2013-03-18 – 2013-03-20 (×3): 81 mg via ORAL
  Filled 2013-03-18 (×3): qty 1

## 2013-03-18 NOTE — Progress Notes (Signed)
BP at hs was 162/112 manually pt was at rest and denies pain.  Given PRN Apressoline 10 mg IV and BP decreased to 143/90.   Pt denies s/s and is NAD.   Will continue to monitor.

## 2013-03-18 NOTE — Progress Notes (Signed)
Subjective:  Denies CP or dyspnea   Objective:  Filed Vitals:   03/17/13 2034 03/17/13 2130 03/17/13 2131 03/17/13 2300  BP:  165/110 162/112 143/90  Pulse:  106 102 96  Temp:  98.4 F (36.9 C)    TempSrc:  Oral    Resp:  20    Height:      Weight:      SpO2: 96% 96%      Intake/Output from previous day:  Intake/Output Summary (Last 24 hours) at 03/18/13 0545 Last data filed at 03/18/13 0300  Gross per 24 hour  Intake    840 ml  Output   1675 ml  Net   -835 ml    Physical Exam: Physical exam: Well-developed obese in no acute distress.  Skin is warm and dry.  HEENT is normal.  Neck is supple. No thyromegaly.  Chest is clear to auscultation with normal expansion.  Cardiovascular exam is regular rate and rhythm.  Abdominal exam nontender or distended. No masses palpated. Extremities show no edema. neuro grossly intact    Lab Results: Basic Metabolic Panel:  Recent Labs  82/95/62 1145 03/15/13 1552  03/16/13 1407 03/17/13 0550  NA 142 143  < > 139 140  K 3.8 3.8  < > 4.3 3.8  CL 107 104  < > 98 98  CO2 24 29  < > 30 33*  GLUCOSE 107* 96  < > 203* 247*  BUN 23 23  < > 21 29*  CREATININE 1.33 1.39*  < > 1.15 1.40*  CALCIUM 9.4 9.5  < > 10.7* 10.3  MG  --  1.8  --   --   --   PHOS  --  3.9  --   --   --   < > = values in this interval not displayed. CBC:  Recent Labs  03/15/13 1145 03/15/13 1552 03/16/13 0321  WBC 7.7 8.7 9.3  NEUTROABS  --  6.3  --   HGB 14.1 14.2 14.4  HCT 42.2 43.5 45.2  MCV 85.1 85.0 86.1  PLT 294 296 317   Cardiac Enzymes:  Recent Labs  03/15/13 1552 03/15/13 2114 03/16/13 0321  TROPONINI <0.30 <0.30 <0.30     Assessment/Plan:  1 acute systolic congestive heart failure-the patient's echocardiogram shows an ejection fraction of 30-35%. His history is consistent with congestive heart failure. Most likely etiology is hypertension that is uncontrolled as he has been noncompliant with medications. Less likely  includes coronary disease or cardiotoxic chemotherapy in the past for his lymphoma (I do not have records of the agents used). I will increase lisinopril to 10 mg po daily and coreg to 6.25 mg po BID; We will titrate these medications as needed for blood pressure control. Change lasix to 20 mg po daily. He has renal insufficiency and we will need to follow his BUN and creatinine closely (BMET pending this AM). He will ultimately need a stress Myoview to screen for coronary disease which could be done as an outpatient. I think coronary disease is unlikely.  2 hypertension-I will continue Norvasc for now. We are adjusting lisinopril and carvedilol as described above and we will titrate. If his blood pressure is not controlled with these medications we will add hydralazine/nitrate.  3 renal failure-the patient is noted to have a mildly elevated creatinine. Follow renal function closely with diuresis and addition of ACE inhibitor.  4 tobacco abuse-patient counseled on discontinuing.  5 obstructive sleep apnea-he needs to be compliant with  CPAP.   Brian Hale 03/18/2013, 5:45 AM

## 2013-03-18 NOTE — Progress Notes (Signed)
[facility] PROGRESS NOTE  Brian Hale NWG:[id number] DOB: January 25, 1976 DOA: 03/15/2013 PCP: METZ,CHRISTINE  Assessment/Plan: Acute respiratory failure with hypoxia / Acute systolic HF - Most likely at this point due to Echocardiogram results due to new diagnosis of Systolic congestive heart failure. - Cardiology on board and managing.  We will f/u with their recommendations.  Active Problems:  Asthma - Continue current therapy with bronchodilators.  Discontinue solumedrol as indicated above.  OBESITY, MORBID  - Nutrition consulted and they have educated patient.  HYPERTENSION  - on amlodipine, coreg, lisinopril - Per cardiology most likely contributing to systolic HF  Code Status: full Family Communication: Discussed with patient and spouse at bedside. Disposition Plan: Pending results of echocardiogram and continued improvement in respiratory rate.  Consultants:  None  Procedures:  2 D echocardiogram  Antibiotics:  None  HPI/Subjective: No acute issues overnight. No chest pain.  Objective: Filed Vitals:   03/18/13 1111 03/18/13 1215 03/18/13 1350 03/18/13 1645  BP: 155/108  149/104 151/104  Pulse: 103  101 106  Temp:   97.6 F (36.4 C)   TempSrc:   Oral   Resp:      Height:      Weight:      SpO2: 97% 97% 99%     Intake/Output Summary (Last 24 hours) at 03/18/13 1758 Last data filed at 03/18/13 5784  Gross per 24 hour  Intake    600 ml  Output   1425 ml  Net   -825 ml   Filed Weights   03/16/13 0542 03/17/13 0656 03/18/13 0500  Weight: 143.7 kg (316 lb 12.8 oz) 141.1 kg (311 lb 1.1 oz) 139.708 kg (308 lb)    Exam:   General:  Pt in NAD, Alert and Awake  Cardiovascular: RRR, No MRG  Respiratory: No wheezes, decreased breath sound at Right base when compared to left  Abdomen: soft, Nt, ND  Musculoskeletal: no cyanosis or clubbing   Data Reviewed: Basic Metabolic Panel:  Recent Labs Lab 03/15/13 1145 03/15/13 1552  03/16/13 0321 03/16/13 1407 03/17/13 0550  NA 142 143 142 139 140  K 3.8 3.8 3.9 4.3 3.8  CL 107 104 101 98 98  CO2 24 29 30 30  33*  GLUCOSE 107* 96 256* 203* 247*  BUN 23 23 25* 21 29*  CREATININE 1.33 1.39* 1.37* 1.15 1.40*  CALCIUM 9.4 9.5 10.0 10.7* 10.3  MG  --  1.8  --   --   --   PHOS  --  3.9  --   --   --    Liver Function Tests:  Recent Labs Lab 03/15/13 1552 03/16/13 0321  AST 20 19  ALT 35 32  ALKPHOS 97 103  BILITOT 0.7 0.6  PROT 6.7 6.8  ALBUMIN 3.7 3.5   No results found for this basename: LIPASE, AMYLASE,  in the last 168 hours No results found for this basename: AMMONIA,  in the last 168 hours CBC:  Recent Labs Lab 03/15/13 1145 03/15/13 1552 03/16/13 0321  WBC 7.7 8.7 9.3  NEUTROABS  --  6.3  --   HGB 14.1 14.2 14.4  HCT 42.2 43.5 45.2  MCV 85.1 85.0 86.1  PLT 294 296 317   Cardiac Enzymes:  Recent Labs Lab 03/15/13 1552 03/15/13 2114 03/16/13 0321  TROPONINI <0.30 <0.30 <0.30   BNP (last 3 results)  Recent Labs  03/15/13 1140  PROBNP 3906.0*   CBG:  Recent Labs Lab 03/16/13 0747 03/17/13 0754 03/18/13 0749  GLUCAP  144* 151* 187*    No results found for this or any previous visit (from the past 240 hour(s)).   Studies: No results found.  Scheduled Meds: . albuterol  2.5 mg Nebulization QID  . amLODipine  10 mg Oral Daily  . aspirin  81 mg Oral Daily  . carvedilol  6.25 mg Oral BID WC  . fluticasone  1 puff Inhalation BID  . furosemide  20 mg Oral Daily  . ipratropium  0.5 mg Nebulization QID  . lisinopril  10 mg Oral Daily  . pneumococcal 23 valent vaccine  0.5 mL Intramuscular Once  . sodium chloride  3 mL Intravenous Q12H   Continuous Infusions:   Principal Problem:   Acute respiratory failure Active Problems:   OBESITY, MORBID   HYPERTENSION   Asthma with acute exacerbation   Acute diastolic CHF (congestive heart failure)   Acute systolic heart failure    Time spent: > 35 minutes    [name]  [facility] Pager [phone] If 7PM-7AM, please contact night-coverage at [email or web address], password [facility] 03/18/2013, 5:58 PM  LOS: 3 days

## 2013-03-19 LAB — BASIC METABOLIC PANEL
CO2: 28 mEq/L (ref 19–32)
Calcium: 9.5 mg/dL (ref 8.4–10.5)
Creatinine, Ser: 1.25 mg/dL (ref 0.50–1.35)

## 2013-03-19 LAB — GLUCOSE, CAPILLARY: Glucose-Capillary: 119 mg/dL — ABNORMAL HIGH (ref 70–99)

## 2013-03-19 MED ORDER — CARVEDILOL 12.5 MG PO TABS
12.5000 mg | ORAL_TABLET | Freq: Two times a day (BID) | ORAL | Status: DC
  Administered 2013-03-19 – 2013-03-20 (×3): 12.5 mg via ORAL
  Filled 2013-03-19 (×5): qty 1

## 2013-03-19 NOTE — Progress Notes (Signed)
TELEMETRY: Reviewed telemetry pt in NSR: Filed Vitals:   03/18/13 1940 03/18/13 2156 03/19/13 0500 03/19/13 0654  BP:  133/91  130/91  Pulse:  93  90  Temp:  97.8 F (36.6 C)  97.8 F (36.6 C)  TempSrc:  Oral  Oral  Resp:  22  20  Height:      Weight:   310 lb 6.5 oz (140.8 kg)   SpO2: 97% 95%  96%    Intake/Output Summary (Last 24 hours) at 03/19/13 0746 Last data filed at 03/19/13 0500  Gross per 24 hour  Intake   1560 ml  Output   1900 ml  Net   -340 ml    SUBJECTIVE Feels better. No chest pain or dyspnea.  LABS: Basic Metabolic Panel:  Recent Labs  16/10/96 1407 03/17/13 0550  NA 139 140  K 4.3 3.8  CL 98 98  CO2 30 33*  GLUCOSE 203* 247*  BUN 21 29*  CREATININE 1.15 1.40*  CALCIUM 10.7* 10.3   Radiology/Studies:  Dg Chest 2 View  03/15/2013   *RADIOLOGY REPORT*  Clinical Data: Chest pain and short of breath.  History of lymphoma  CHEST - 2 VIEW  Comparison: 12/03/2006  Findings: Cardiac enlargement.  Pulmonary vascular congestion with interstitial edema.  No significant effusion.  No mass or adenopathy.  Right lower lobe airspace disease most consistent with atelectasis or infiltrate.  IMPRESSION: Cardiac enlargement with mild edema compatible with fluid overload.   Original Report Authenticated By: Janeece Riggers, M.D.   Ecg: sinus tachy without acute ST-T changes.  ECHO:Study Conclusions  - Left ventricle: The cavity size was moderately dilated. Wall thickness was increased in a pattern of mild LVH. Systolic function was moderately to severely reduced. The estimated ejection fraction was in the range of 30% to 35%. Diffuse hypokinesis. Features are consistent with a pseudonormal left ventricular filling pattern, with concomitant abnormal relaxation and increased filling pressure (grade 2 diastolic dysfunction). - Aortic valve: There was no stenosis. - Mitral valve: Trivial regurgitation. - Left atrium: The atrium was moderately dilated. - Right  ventricle: The cavity size was normal. Systolic function was normal. - Atrial septum: The septum bowed from left to right, consistent with increased left atrial pressure. - Tricuspid valve: Peak RV-RA gradient:17mm Hg (S). - Pulmonary arteries: PA systolic pressure 42-46 mmHg. - Systemic veins: IVC measured 2.5 cm with some respirophasic variation, suggesting RA pressure 11-15 mmHg. - Pericardium, extracardiac: A trivial pericardial effusion was identified posterior to the heart. Impressions:  - Normal LV size with mild LV hypertrophy. EF 30-35%. Diffuse hypokinesis. Normal RV size and systolic function. Mild pulmonary hypertension. No significant valvular abnormalities.   PHYSICAL EXAM General: Well developed, morbidly obese, in no acute distress. Head: Normal Neck: Negative for carotid bruits. JVD not elevated. Lungs: Clear bilaterally to auscultation without wheezes, rales, or rhonchi. Breathing is unlabored. Heart: RRR S1 S2 without murmurs, rubs, or gallops.  Abdomen: Soft, non-tender, non-distended with normoactive bowel sounds. No hepatomegaly.  Extremities: 1+ edema.  Distal pedal pulses are 2+ and equal bilaterally. Neuro: Alert and oriented X 3. Moves all extremities spontaneously.   ASSESSMENT AND PLAN: 1. Acute systolic CHF with EF 30-35%. Most likely etiology is hypertensive heart disease. Plan stress myoview as outpatient to rule out ischemia. Stress importance of dietary and medical compliance. Sodium restriction. 2. HTN control improved. Will increase coreg to 12.5 mg bid. 3. Acute renal insufficiency. Creatinine 1.15>>1.4. No labs ordered today. Will check BMET daily with addition  of ACEi. 4. Tobacco abuse. Counseled on cessation. Plans to quit cold [place]. 5. Morbid obesity with OSA.   If renal function stable/improved may be able to discharge tomorrow. Encouraged ambulation in hall.  Principal Problem:   Acute respiratory failure Active Problems:    OBESITY, MORBID   HYPERTENSION   Asthma with acute exacerbation   Acute diastolic CHF (congestive heart failure)   Acute systolic heart failure    Signed, Peter [place] MD,FACC 03/19/2013 7:53 AM

## 2013-03-19 NOTE — Progress Notes (Signed)
[facility] PROGRESS NOTE  Brian Hale ZOX:[id number] DOB: [birth year] DOA: 03/15/2013 PCP: METZ,CHRISTINE  Assessment/Plan: Acute respiratory failure with hypoxia / Acute systolic HF - Most likely at this point due to Echocardiogram results due to new diagnosis of Systolic congestive heart failure. - Cardiology on board and managing.  We will f/u with their recommendations. Carvedilol recently increased.  Active Problems:  Asthma - Continue current therapy with bronchodilators.  Discontinue solumedrol as I feel that SOB was most likely due to CHF exacerbation which is improving with the recommendations outlined by cardiology. - On discharge will prescribe albuterol hfa given that patient reports that he does not have any at home.  OBESITY, MORBID  - Nutrition consulted and they have educated patient.  HYPERTENSION  - on amlodipine, coreg, lisinopril - Per cardiology history of uncontrolled htn most likely contributed to systolic HF  Code Status: full Family Communication: Discussed with patient and spouse at bedside. Disposition Plan: Pending further recommendations from specialist.  With potential discharge next AM 03/20/13  Consultants:  None  Procedures:  2 D echocardiogram  Antibiotics:  None  HPI/Subjective: No acute issues overnight. No chest pain.  Objective: Filed Vitals:   03/19/13 0654 03/19/13 0806 03/19/13 0901 03/19/13 1154  BP: 130/91     Pulse: 90 96    Temp: 97.8 F (36.6 C)     TempSrc: Oral     Resp: 20     Height:      Weight:      SpO2: 96%  98% 95%    Intake/Output Summary (Last 24 hours) at 03/19/13 1222 Last data filed at 03/19/13 0500  Gross per 24 hour  Intake   1320 ml  Output   1900 ml  Net   -580 ml   Filed Weights   03/17/13 0656 03/18/13 0500 03/19/13 0500  Weight: 141.1 kg (311 lb 1.1 oz) 139.708 kg (308 lb) 140.8 kg (310 lb 6.5 oz)    Exam:   General:  Pt in NAD, Alert and Awake  Cardiovascular: RRR, No  MRG  Respiratory: No wheezes, decreased breath sound at Right base when compared to left  Abdomen: soft, Nt, ND  Musculoskeletal: no cyanosis or clubbing   Data Reviewed: Basic Metabolic Panel:  Recent Labs Lab 03/15/13 1145 03/15/13 1552 03/16/13 0321 03/16/13 1407 03/17/13 0550 03/19/13 0805  NA 142 143 142 139 140 140  K 3.8 3.8 3.9 4.3 3.8 4.0  CL 107 104 101 98 98 102  CO2 24 29 30 30  33* 28  GLUCOSE 107* 96 256* 203* 247* 112*  BUN 23 23 25* 21 29* 32*  CREATININE 1.33 1.39* 1.37* 1.15 1.40* 1.25  CALCIUM 9.4 9.5 10.0 10.7* 10.3 9.5  MG  --  1.8  --   --   --   --   PHOS  --  3.9  --   --   --   --    Liver Function Tests:  Recent Labs Lab 03/15/13 1552 03/16/13 0321  AST 20 19  ALT 35 32  ALKPHOS 97 103  BILITOT 0.7 0.6  PROT 6.7 6.8  ALBUMIN 3.7 3.5   No results found for this basename: LIPASE, AMYLASE,  in the last 168 hours No results found for this basename: AMMONIA,  in the last 168 hours CBC:  Recent Labs Lab 03/15/13 1145 03/15/13 1552 03/16/13 0321  WBC 7.7 8.7 9.3  NEUTROABS  --  6.3  --   HGB 14.1 14.2 14.4  HCT 42.2  43.5 45.2  MCV 85.1 85.0 86.1  PLT 294 296 317   Cardiac Enzymes:  Recent Labs Lab 03/15/13 1552 03/15/13 2114 03/16/13 0321  TROPONINI <0.30 <0.30 <0.30   BNP (last 3 results)  Recent Labs  03/15/13 1140  PROBNP 3906.0*   CBG:  Recent Labs Lab 03/16/13 0747 03/17/13 0754 03/18/13 0749 03/19/13 0753  GLUCAP 144* 151* 187* 119*    No results found for this or any previous visit (from the past 240 hour(s)).   Studies: No results found.  Scheduled Meds: . albuterol  2.5 mg Nebulization QID  . amLODipine  10 mg Oral Daily  . aspirin  81 mg Oral Daily  . carvedilol  12.5 mg Oral BID WC  . fluticasone  1 puff Inhalation BID  . furosemide  20 mg Oral Daily  . ipratropium  0.5 mg Nebulization QID  . lisinopril  10 mg Oral Daily  . pneumococcal 23 valent vaccine  0.5 mL Intramuscular Once  .  sodium chloride  3 mL Intravenous Q12H   Continuous Infusions:   Principal Problem:   Acute respiratory failure Active Problems:   OBESITY, MORBID   HYPERTENSION   Asthma with acute exacerbation   Acute diastolic CHF (congestive heart failure)   Acute systolic heart failure    Time spent: > 35 minutes    [name]  [facility] Pager [phone] If 7PM-7AM, please contact night-coverage at [email or web address], password [facility] 03/19/2013, 12:22 PM  LOS: 4 days

## 2013-03-19 NOTE — Progress Notes (Signed)
Pt has his home cpap setup & will place on himself when ready.  Jacqulynn Cadet RRT

## 2013-03-20 LAB — BASIC METABOLIC PANEL
CO2: 31 mEq/L (ref 19–32)
Calcium: 9.2 mg/dL (ref 8.4–10.5)
Creatinine, Ser: 1.22 mg/dL (ref 0.50–1.35)
Glucose, Bld: 108 mg/dL — ABNORMAL HIGH (ref 70–99)

## 2013-03-20 LAB — GLUCOSE, CAPILLARY: Glucose-Capillary: 103 mg/dL — ABNORMAL HIGH (ref 70–99)

## 2013-03-20 MED ORDER — FLUTICASONE PROPIONATE HFA 44 MCG/ACT IN AERO: 1.0000 | INHALATION_SPRAY | Freq: Two times a day (BID) | RESPIRATORY_TRACT | Status: DC

## 2013-03-20 MED ORDER — ALBUTEROL SULFATE HFA 108 (90 BASE) MCG/ACT IN AERS: 2.0000 | INHALATION_SPRAY | Freq: Four times a day (QID) | RESPIRATORY_TRACT | Status: DC | PRN

## 2013-03-20 MED ORDER — AMLODIPINE BESYLATE 10 MG PO TABS: 10.0000 mg | ORAL_TABLET | Freq: Every day | ORAL | Status: DC

## 2013-03-20 MED ORDER — ASPIRIN 81 MG PO CHEW: 81.0000 mg | CHEWABLE_TABLET | Freq: Every day | ORAL | Status: AC

## 2013-03-20 MED ORDER — FUROSEMIDE 20 MG PO TABS: 20.0000 mg | ORAL_TABLET | Freq: Every day | ORAL | Status: DC

## 2013-03-20 MED ORDER — CARVEDILOL 12.5 MG PO TABS: 12.5000 mg | ORAL_TABLET | Freq: Two times a day (BID) | ORAL | Status: DC

## 2013-03-20 MED ORDER — ALBUTEROL SULFATE (5 MG/ML) 0.5% IN NEBU: 2.5000 mg | INHALATION_SOLUTION | Freq: Two times a day (BID) | RESPIRATORY_TRACT | Status: DC

## 2013-03-20 MED ORDER — LISINOPRIL 10 MG PO TABS: 10.0000 mg | ORAL_TABLET | Freq: Every day | ORAL | Status: DC

## 2013-03-20 NOTE — Progress Notes (Signed)
Report given, assuming patient care. Agree with documented shift assessment. J.Samatha Anspach, RN

## 2013-03-20 NOTE — Discharge Summary (Signed)
Physician Discharge Summary  Brian Hale NWG:[id number] DOB: 1976-09-12 DOA: 03/15/2013  PCP: METZ,CHRISTINE  Admit date: 03/15/2013 Discharge date: 03/20/2013  Time spent: > 35 minutes  Recommendations for Outpatient Follow-up:  Please be sure to follow up with your primary care physician for your asthma and blood pressure  Also you are to follow up with your Cardiologist for further testing.  Discharge Diagnoses:  Principal Problem:   Acute respiratory failure Active Problems:   OBESITY, MORBID   HYPERTENSION   Asthma with acute exacerbation   Acute diastolic CHF (congestive heart failure)   Acute systolic heart failure   Discharge Condition: Stable  Diet recommendation: Heart healthy/low sodium  Filed Weights   03/18/13 0500 03/19/13 0500 03/20/13 0500  Weight: 139.708 kg (308 lb) 140.8 kg (310 lb 6.5 oz) 140.9 kg (310 lb 10.1 oz)    History of present illness:  37 y/o with h/o asthma and HTN who presented with SOB and during hospital stay diagnosed with new onset Systolic HF  Hospital Course:  Acute respiratory failure with hypoxia / Acute systolic HF  -  Echocardiogram results due to new diagnosis of Systolic congestive heart failure with ef of 30-35% - Cardiology on board recommended the following:  ASSESSMENT AND PLAN:  1. Acute systolic CHF with EF 30-35%. Most likely etiology is hypertensive heart disease. Plan stress myoview as outpatient to rule out ischemia. Stress importance of dietary and medical compliance. Sodium restriction.  2. HTN control improved. On coreg to 12.5 mg bid.  3. Acute renal insufficiency. Creatinine 1.15>>1.4. Now down to 1.22. On ACEi.  4. Tobacco abuse. Counseled on cessation. Plans to quit cold [place].  5. Morbid obesity with OSA.   Active Problems:  Asthma  - provide script for albuterol - patient to continue flovent  OBESITY, MORBID  - Nutrition consulted and they have educated patient.   HYPERTENSION  - on amlodipine,  coreg, lisinopril  - Per cardiology history of uncontrolled htn most likely contributed to systolic HF  Procedures:  Echocardiogram  Consultations:  Cardiology: Last seen by Dr. Peter [place]   Discharge Exam: Filed Vitals:   03/20/13 0500 03/20/13 5784 03/20/13 0833 03/20/13 1154  BP:  132/100    Pulse:  93 92   Temp:  98.2 F (36.8 C)    TempSrc:  Oral    Resp:  20    Height:      Weight: 140.9 kg (310 lb 10.1 oz)     SpO2:  100%  98%    General: Pt in NAD, Alert and Awake Cardiovascular: RRR, no mrg Respiratory: CTA BL, no wheezes, breathing comfortably on room air  Discharge Instructions  Discharge Orders   Future Orders Complete By Expires     Call MD for:  difficulty breathing, headache or visual disturbances  As directed     Call MD for:  temperature >100.4  As directed     Diet - low sodium heart healthy  As directed     Discharge instructions  As directed     Comments:      Please be sure to follow up with your primary care physician in 1-2 weeks or sooner should any new concerns arise.  Also follow up with your cardiologist as you will need further tests given your recent new diagnosis of systolic heart failure.    Increase activity slowly  As directed         Medication List    STOP taking these medications  beclomethasone 40 MCG/ACT inhaler  Commonly known as:  QVAR  Replaced by:  fluticasone 44 MCG/ACT inhaler      TAKE these medications       albuterol 108 (90 BASE) MCG/ACT inhaler  Commonly known as:  PROVENTIL HFA;VENTOLIN HFA  Inhale 2 puffs into the lungs every 6 (six) hours as needed for wheezing.     amLODipine 10 MG tablet  Commonly known as:  NORVASC  Take 1 tablet (10 mg total) by mouth daily.     aspirin 81 MG chewable tablet  Chew 1 tablet (81 mg total) by mouth daily.     carvedilol 12.5 MG tablet  Commonly known as:  COREG  Take 1 tablet (12.5 mg total) by mouth 2 (two) times daily with a meal.     fluticasone 44  MCG/ACT inhaler  Commonly known as:  FLOVENT HFA  Inhale 1 puff into the lungs 2 (two) times daily.     furosemide 20 MG tablet  Commonly known as:  LASIX  Take 1 tablet (20 mg total) by mouth daily.     lisinopril 10 MG tablet  Commonly known as:  PRINIVIL,ZESTRIL  Take 1 tablet (10 mg total) by mouth daily.       No Known Allergies    The results of significant diagnostics from this hospitalization (including imaging, microbiology, ancillary and laboratory) are listed below for reference.    Significant Diagnostic Studies: Dg Chest 2 View  03/15/2013   *RADIOLOGY REPORT*  Clinical Data: Chest pain and short of breath.  History of lymphoma  CHEST - 2 VIEW  Comparison: 12/03/2006  Findings: Cardiac enlargement.  Pulmonary vascular congestion with interstitial edema.  No significant effusion.  No mass or adenopathy.  Right lower lobe airspace disease most consistent with atelectasis or infiltrate.  IMPRESSION: Cardiac enlargement with mild edema compatible with fluid overload.   Original Report Authenticated By: Janeece Riggers, M.D.    Microbiology: No results found for this or any previous visit (from the past 240 hour(s)).   Labs: Basic Metabolic Panel:  Recent Labs Lab 03/15/13 1145 03/15/13 1552 03/16/13 0321 03/16/13 1407 03/17/13 0550 03/19/13 0805 03/20/13 0420  NA 142 143 142 139 140 140 139  K 3.8 3.8 3.9 4.3 3.8 4.0 3.8  CL 107 104 101 98 98 102 101  CO2 24 29 30 30  33* 28 31  GLUCOSE 107* 96 256* 203* 247* 112* 108*  BUN 23 23 25* 21 29* 32* 31*  CREATININE 1.33 1.39* 1.37* 1.15 1.40* 1.25 1.22  CALCIUM 9.4 9.5 10.0 10.7* 10.3 9.5 9.2  MG  --  1.8  --   --   --   --   --   PHOS  --  3.9  --   --   --   --   --    Liver Function Tests:  Recent Labs Lab 03/15/13 1552 03/16/13 0321  AST 20 19  ALT 35 32  ALKPHOS 97 103  BILITOT 0.7 0.6  PROT 6.7 6.8  ALBUMIN 3.7 3.5   No results found for this basename: LIPASE, AMYLASE,  in the last 168 hours No  results found for this basename: AMMONIA,  in the last 168 hours CBC:  Recent Labs Lab 03/15/13 1145 03/15/13 1552 03/16/13 0321  WBC 7.7 8.7 9.3  NEUTROABS  --  6.3  --   HGB 14.1 14.2 14.4  HCT 42.2 43.5 45.2  MCV 85.1 85.0 86.1  PLT 294 296 317   Cardiac  Enzymes:  Recent Labs Lab 03/15/13 1552 03/15/13 2114 03/16/13 0321  TROPONINI <0.30 <0.30 <0.30   BNP: BNP (last 3 results)  Recent Labs  03/15/13 1140  PROBNP 3906.0*   CBG:  Recent Labs Lab 03/16/13 0747 03/17/13 0754 03/18/13 0749 03/19/13 0753 03/20/13 0816  GLUCAP 144* 151* 187* 119* 103*       Signed:  [name]  [facility] 03/20/2013, 1:01 PM

## 2013-03-20 NOTE — Progress Notes (Signed)
TELEMETRY: Reviewed telemetry pt in NSR: Filed Vitals:   03/19/13 2214 03/20/13 0500 03/20/13 0632 03/20/13 0833  BP: 129/97  132/100   Pulse: 95  93 92  Temp: 97.9 F (36.6 C)  98.2 F (36.8 C)   TempSrc: Oral  Oral   Resp: 22  20   Height:      Weight:  310 lb 10.1 oz (140.9 kg)    SpO2: 99%  100%     Intake/Output Summary (Last 24 hours) at 03/20/13 0835 Last data filed at 03/19/13 1952  Gross per 24 hour  Intake    960 ml  Output      0 ml  Net    960 ml    SUBJECTIVE Feels better. No chest pain or dyspnea. Ambulating without difficulty.  LABS: Basic Metabolic Panel:  Recent Labs  62/13/08 0805 03/20/13 0420  NA 140 139  K 4.0 3.8  CL 102 101  CO2 28 31  GLUCOSE 112* 108*  BUN 32* 31*  CREATININE 1.25 1.22  CALCIUM 9.5 9.2   Radiology/Studies:  Dg Chest 2 View  03/15/2013   *RADIOLOGY REPORT*  Clinical Data: Chest pain and short of breath.  History of lymphoma  CHEST - 2 VIEW  Comparison: 12/03/2006  Findings: Cardiac enlargement.  Pulmonary vascular congestion with interstitial edema.  No significant effusion.  No mass or adenopathy.  Right lower lobe airspace disease most consistent with atelectasis or infiltrate.  IMPRESSION: Cardiac enlargement with mild edema compatible with fluid overload.   Original Report Authenticated By: Janeece Riggers, M.D.   Ecg: sinus tachy without acute ST-T changes.  ECHO:Study Conclusions  - Left ventricle: The cavity size was moderately dilated. Wall thickness was increased in a pattern of mild LVH. Systolic function was moderately to severely reduced. The estimated ejection fraction was in the range of 30% to 35%. Diffuse hypokinesis. Features are consistent with a pseudonormal left ventricular filling pattern, with concomitant abnormal relaxation and increased filling pressure (grade 2 diastolic dysfunction). - Aortic valve: There was no stenosis. - Mitral valve: Trivial regurgitation. - Left atrium: The atrium was  moderately dilated. - Right ventricle: The cavity size was normal. Systolic function was normal. - Atrial septum: The septum bowed from left to right, consistent with increased left atrial pressure. - Tricuspid valve: Peak RV-RA gradient:67mm Hg (S). - Pulmonary arteries: PA systolic pressure 42-46 mmHg. - Systemic veins: IVC measured 2.5 cm with some respirophasic variation, suggesting RA pressure 11-15 mmHg. - Pericardium, extracardiac: A trivial pericardial effusion was identified posterior to the heart. Impressions:  - Normal LV size with mild LV hypertrophy. EF 30-35%. Diffuse hypokinesis. Normal RV size and systolic function. Mild pulmonary hypertension. No significant valvular abnormalities.   PHYSICAL EXAM General: Well developed, morbidly obese, in no acute distress. Head: Normal Neck: Negative for carotid bruits. JVD not elevated. Lungs: Clear bilaterally to auscultation without wheezes, rales, or rhonchi. Breathing is unlabored. Heart: RRR S1 S2 without murmurs, rubs, or gallops.  Abdomen: Soft, non-tender, non-distended with normoactive bowel sounds. No hepatomegaly.  Extremities: 1+ edema.  Distal pedal pulses are 2+ and equal bilaterally. Neuro: Alert and oriented X 3. Moves all extremities spontaneously.   ASSESSMENT AND PLAN: 1. Acute systolic CHF with EF 30-35%. Most likely etiology is hypertensive heart disease. Plan stress myoview as outpatient to rule out ischemia. Stress importance of dietary and medical compliance. Sodium restriction. 2. HTN control improved. On coreg to 12.5 mg bid. 3. Acute renal insufficiency. Creatinine 1.15>>1.4. Now down to  1.22. On ACEi. 4. Tobacco abuse. Counseled on cessation. Plans to quit cold [place]. 5. Morbid obesity with OSA.   Stable for discharge today. Will arrange outpatient myoview and cardiology follow up.  Principal Problem:   Acute respiratory failure Active Problems:   OBESITY, MORBID   HYPERTENSION   Asthma  with acute exacerbation   Acute diastolic CHF (congestive heart failure)   Acute systolic heart failure    Signed, Crewe Heathman [place] MD,FACC 03/20/2013 8:35 AM

## 2013-06-18 ENCOUNTER — Encounter: Payer: Self-pay | Admitting: Cardiology

## 2013-06-19 ENCOUNTER — Ambulatory Visit (INDEPENDENT_AMBULATORY_CARE_PROVIDER_SITE_OTHER): Payer: BC Managed Care – PPO | Admitting: Cardiology

## 2013-06-19 ENCOUNTER — Encounter: Payer: Self-pay | Admitting: Cardiology

## 2013-06-19 VITALS — BP 168/100 | HR 82 | Ht 72.0 in | Wt 306.0 lb

## 2013-06-19 DIAGNOSIS — I428 Other cardiomyopathies: Secondary | ICD-10-CM

## 2013-06-19 DIAGNOSIS — I5021 Acute systolic (congestive) heart failure: Secondary | ICD-10-CM

## 2013-06-19 DIAGNOSIS — N289 Disorder of kidney and ureter, unspecified: Secondary | ICD-10-CM

## 2013-06-19 DIAGNOSIS — I509 Heart failure, unspecified: Secondary | ICD-10-CM

## 2013-06-19 DIAGNOSIS — I10 Essential (primary) hypertension: Secondary | ICD-10-CM

## 2013-06-19 DIAGNOSIS — I420 Dilated cardiomyopathy: Secondary | ICD-10-CM | POA: Insufficient documentation

## 2013-06-19 DIAGNOSIS — I5031 Acute diastolic (congestive) heart failure: Secondary | ICD-10-CM

## 2013-06-19 MED ORDER — LISINOPRIL 20 MG PO TABS: 20.0000 mg | ORAL_TABLET | Freq: Every day | ORAL | Status: DC

## 2013-06-19 MED ORDER — FUROSEMIDE 20 MG PO TABS: 20.0000 mg | ORAL_TABLET | Freq: Every day | ORAL | Status: DC

## 2013-06-19 MED ORDER — CARVEDILOL 12.5 MG PO TABS: 12.5000 mg | ORAL_TABLET | Freq: Two times a day (BID) | ORAL | Status: DC

## 2013-06-19 NOTE — Assessment & Plan Note (Addendum)
Patient has ejection fraction of 30-35%. I think his cardiomyopathy is most likely related to uncontrolled hypertension. He is not taking his medications at present. I have stressed the importance of compliance and the potential in organ damage associated with untreated blood pressure. We will resume lisinopril 20 mg daily, carvedilol 12.5 BID and Lasix 20 mg daily. Check potassium and renal function in one week. I will have him followup with one of our physician assistants in 2-4 weeks to advance medications as tolerated. Once fully titrated and blood pressure controlled we will plan to repeat his echocardiogram to reassess LV function. If ejection fraction less than 35% he would need to be considered for ICD. We will also arrange for a nuclear scan to screen for coronary disease once his blood pressure is controlled. However I think uncontrolled hypertension is more likely the cause of his cardiomyopathy compared to coronary disease.

## 2013-06-19 NOTE — Assessment & Plan Note (Signed)
Blood pressure is elevated. Resume medications as outlined under cardiomyopathy.

## 2013-06-19 NOTE — Assessment & Plan Note (Signed)
euvolemic on examination.resume previous dose of Lasix.

## 2013-06-19 NOTE — Patient Instructions (Addendum)
Your physician recommends that you schedule a follow-up appointment in: 3 MONTHS WITH DR Jens Som  Your physician recommends that you schedule a follow-up appointment in: 2-4 WEEKS WITH EXTENDER FOR MEDICATION TITRATION  RESTART CARVEDILOL 12.5 MG TWICE DAILY  RESTART FUROSEMIDE 20 MG ONCE DAILY  RESTART  LISINOPRIL 20 MG ONCE DAILY  Your physician recommends that you return for lab work in: ONE WEEK   REFERRAL TO PRIMARY CARE MD

## 2013-06-19 NOTE — Assessment & Plan Note (Signed)
Patient noted to have mild renal insufficiency in the hospital. Follow renal function.

## 2013-06-19 NOTE — Progress Notes (Signed)
   HPI: pleasant male for followup of cardiomyopathy and congestive heart failure. Patient recently admitted with congestive heart failure. Echocardiogram in may of 2014 showed an ejection fraction of 30-35% and grade 2 diastolic dysfunction. The left atrium was moderately dilated and there was trace mitral regurgitation. TSH normal. Patient had medications added and adjusted. Cardiomyopathy felt most likely related to hypertension. Nuclear study planned as an outpatient to screen for coronary disease. Since discharge he denies dyspnea, chest pain, palpitations or syncope. He has not taken his medications in the past 5-10 days.  Current Outpatient Prescriptions  Medication Sig Dispense Refill  . albuterol (PROVENTIL HFA;VENTOLIN HFA) 108 (90 BASE) MCG/ACT inhaler Inhale 2 puffs into the lungs every 6 (six) hours as needed for wheezing.  1 Inhaler  0  . amLODipine (NORVASC) 10 MG tablet Take 1 tablet (10 mg total) by mouth daily.  30 tablet  0  . aspirin 81 MG chewable tablet Chew 1 tablet (81 mg total) by mouth daily.  30 tablet  0  . carvedilol (COREG) 12.5 MG tablet Take 1 tablet (12.5 mg total) by mouth 2 (two) times daily with a meal.  60 tablet  0  . fluticasone (FLOVENT HFA) 44 MCG/ACT inhaler Inhale 1 puff into the lungs 2 (two) times daily.  1 Inhaler  0  . furosemide (LASIX) 20 MG tablet Take 1 tablet (20 mg total) by mouth daily.  30 tablet  0  . lisinopril (PRINIVIL,ZESTRIL) 10 MG tablet Take 1 tablet (10 mg total) by mouth daily.  30 tablet  0   No current facility-administered medications for this visit.     Past Medical History  Diagnosis Date  . Lymphoma in remission   . Asthma   . Hypertension   . OSA (obstructive sleep apnea)   . Cardiomyopathy     Past Surgical History  Procedure Laterality Date  . Port-a-cath removal      History   Social History  . Marital Status: Single    Spouse Name: N/A    Number of Children: N/A  . Years of Education: N/A   Occupational  History  . Not on file.   Social History Main Topics  . Smoking status: Former Smoker -- 0.10 packs/day for 20 years    Types: Cigars    Quit date: 02/22/2013  . Smokeless tobacco: Never Used  . Alcohol Use: 21.6 oz/week    36 Cans of beer per week  . Drug Use: No  . Sexual Activity: No   Other Topics Concern  . Not on file   Social History Narrative  . No narrative on file    ROS: no fevers or chills, productive cough, hemoptysis, dysphasia, odynophagia, melena, hematochezia, dysuria, hematuria, rash, seizure activity, orthopnea, PND, pedal edema, claudication. Remaining systems are negative.  Physical Exam: Well-developed obese in no acute distress.  Skin is warm and dry.  HEENT is normal.  Neck is supple.  Chest is clear to auscultation with normal expansion.  Cardiovascular exam is regular rate and rhythm.  Abdominal exam nontender or distended. No masses palpated. Extremities show no edema. neuro grossly intact  ECG sinus rhythm at a rate of 82. Nonspecific T-wave changes.

## 2013-06-26 ENCOUNTER — Other Ambulatory Visit (INDEPENDENT_AMBULATORY_CARE_PROVIDER_SITE_OTHER): Payer: BC Managed Care – PPO

## 2013-06-26 DIAGNOSIS — I5021 Acute systolic (congestive) heart failure: Secondary | ICD-10-CM

## 2013-06-26 DIAGNOSIS — I420 Dilated cardiomyopathy: Secondary | ICD-10-CM

## 2013-06-26 DIAGNOSIS — I10 Essential (primary) hypertension: Secondary | ICD-10-CM

## 2013-06-26 DIAGNOSIS — N289 Disorder of kidney and ureter, unspecified: Secondary | ICD-10-CM

## 2013-06-26 DIAGNOSIS — I5031 Acute diastolic (congestive) heart failure: Secondary | ICD-10-CM

## 2013-06-26 DIAGNOSIS — I509 Heart failure, unspecified: Secondary | ICD-10-CM

## 2013-06-26 DIAGNOSIS — I428 Other cardiomyopathies: Secondary | ICD-10-CM

## 2013-06-26 LAB — BASIC METABOLIC PANEL
BUN: 17 mg/dL (ref 6–23)
CO2: 26 mEq/L (ref 19–32)
GFR: 86.26 mL/min (ref >60.00)
Glucose, Bld: 97 mg/dL (ref 70–99)
Potassium: 3.7 mEq/L (ref 3.5–5.1)

## 2013-07-10 ENCOUNTER — Encounter: Payer: Self-pay | Admitting: Physician Assistant

## 2013-07-10 ENCOUNTER — Ambulatory Visit (INDEPENDENT_AMBULATORY_CARE_PROVIDER_SITE_OTHER): Payer: BC Managed Care – PPO | Admitting: Physician Assistant

## 2013-07-10 VITALS — BP 150/110 | HR 80 | Ht 72.0 in | Wt 296.0 lb

## 2013-07-10 DIAGNOSIS — I420 Dilated cardiomyopathy: Secondary | ICD-10-CM

## 2013-07-10 DIAGNOSIS — I5022 Chronic systolic (congestive) heart failure: Secondary | ICD-10-CM

## 2013-07-10 DIAGNOSIS — I10 Essential (primary) hypertension: Secondary | ICD-10-CM

## 2013-07-10 DIAGNOSIS — I428 Other cardiomyopathies: Secondary | ICD-10-CM

## 2013-07-10 MED ORDER — CARVEDILOL 12.5 MG PO TABS: 25.0000 mg | ORAL_TABLET | Freq: Two times a day (BID) | ORAL | Status: DC

## 2013-07-10 NOTE — Progress Notes (Signed)
[address], [address] [place], [place]  [address] Phone: [phone] Fax:  [phone]  Date:  07/10/2013   ID:  BIFF RUTIGLIANO, DOB 1976-05-11, MRN [id number]  PCP:  METZ,CHRISTINE  Cardiologist:  Dr. Olga Millers     History of Present Illness: Brian Hale is a 37 y.o. male who returns for follow up.  He has a hx of dilated cardiomyopathy, chronic systolic CHF, HTN, lymphoma, asthma, sleep apnea.  He was admitted to the hospital in 5/14 with acute systolic heart failure. Echocardiogram 5/14:  EF 30-35%,  mild LVH, grade 2 diastolic dysfunction), trivial MR, mod LAE, PASP 42-46 mmHg, trivial eff.  Diuresis was c/b AKI.  Creatinine returned to baseline prior to d/c.  Cardiomyopathy was felt to be most likely related to hypertension. Nuclear study was planned as an outpatient to screen for CAD. He was seen by Dr. Jens Som 06/2013. He was off of his medications and his blood pressure was uncontrolled. Lisinopril, carvedilol and Lasix were resumed. Nuclear study was deferred until blood pressure was better controlled.  The patient denies chest pain, significant shortness of breath, syncope, orthopnea, PND or significant pedal edema.  He is NYHA Class II.   Labs (5/14):  Hgb 14.4, TSH 2.122 Labs (8/14):  K 3.7, Cr 1.2  Wt Readings from Last 3 Encounters:  07/10/13 296 lb (134.265 kg)  06/19/13 306 lb (138.801 kg)  03/20/13 310 lb 10.1 oz (140.9 kg)     Past Medical History  Diagnosis Date  . Lymphoma in remission   . Asthma   . Hypertension   . OSA (obstructive sleep apnea)   . Cardiomyopathy     Current Outpatient Prescriptions  Medication Sig Dispense Refill  . albuterol (PROVENTIL HFA;VENTOLIN HFA) 108 (90 BASE) MCG/ACT inhaler Inhale 2 puffs into the lungs every 6 (six) hours as needed for wheezing.  1 Inhaler  0  . aspirin 81 MG chewable tablet Chew 1 tablet (81 mg total) by mouth daily.  30 tablet  0  . carvedilol (COREG) 12.5 MG tablet Take 1 tablet (12.5 mg  total) by mouth 2 (two) times daily with a meal.  60 tablet  12  . fluticasone (FLOVENT HFA) 44 MCG/ACT inhaler Inhale 1 puff into the lungs 2 (two) times daily.  1 Inhaler  0  . furosemide (LASIX) 20 MG tablet Take 1 tablet (20 mg total) by mouth daily.  30 tablet  12  . lisinopril (PRINIVIL,ZESTRIL) 20 MG tablet Take 1 tablet (20 mg total) by mouth daily.  30 tablet  12   No current facility-administered medications for this visit.    Allergies:   No Known Allergies  Social History:  The patient  reports that he quit smoking about 4 months ago. His smoking use included Cigars. He has never used smokeless tobacco. He reports that he drinks about 21.6 ounces of alcohol per week. He reports that he does not use illicit drugs.   ROS:  Please see the history of present illness.   He has had some right leg pain in the L2-3 dermatome  He is seeing a Land.  All other systems reviewed and negative.   PHYSICAL EXAM: VS:  BP 150/110  Pulse 80  Ht 6' (1.829 m)  Wt 296 lb (134.265 kg)  BMI 40.14 kg/m2 Well nourished, well developed, in no acute distress HEENT: normal Neck: no JVD Cardiac:  normal S1, S2; RRR; no murmur Lungs:  clear to auscultation bilaterally, no wheezing, rhonchi or  rales Abd: soft, nontender, no hepatomegaly Ext: no edema Skin: warm and dry Neuro:  CNs 2-12 intact, no focal abnormalities noted  EKG:  NSR, HR 80, normal axis, NSSTTW changes     ASSESSMENT AND PLAN:  1. Hypertension:  Uncontrolled.  He admits to compliance with meds. Denies high salt diet.  Increase Coreg to 25 mg bid. 2. Chronic Systolic CHF:  Volume stable.  Continue current dose of Lasix, ACE inhibitor.  Increase beta blocker as noted.  3. Cardiomyopathy:  Likely related to untreated HTN.  Will plan on myoview once BP better controlled.  Will need repeat Echo once on maximal medical Rx to reassess EF.  Consider adding Spironolactone, Hydralazine and Nitrates if BP remains high.  Would try to add  Spironolactone at next visit if BP high. 4. Obesity:  Probably has OSA.  Will need to address this over time.  5. Disposition: F/u with me in 2 weeks.   Signed, Tereso Newcomer, PA-C  07/10/2013 3:44 PM

## 2013-07-10 NOTE — Patient Instructions (Addendum)
INCREASE COREG TO 25 MG 2 TIMES DAILY  YOU HAVE A FOLLOW UP APPT SCHEDULED WITH [facility] 07/27/13 @ 11:10 AM

## 2013-07-27 ENCOUNTER — Encounter: Payer: Self-pay | Admitting: Physician Assistant

## 2013-07-27 ENCOUNTER — Ambulatory Visit (INDEPENDENT_AMBULATORY_CARE_PROVIDER_SITE_OTHER): Payer: BC Managed Care – PPO | Admitting: Physician Assistant

## 2013-07-27 VITALS — BP 153/95 | HR 77 | Ht 72.0 in | Wt 304.0 lb

## 2013-07-27 DIAGNOSIS — I428 Other cardiomyopathies: Secondary | ICD-10-CM

## 2013-07-27 DIAGNOSIS — I10 Essential (primary) hypertension: Secondary | ICD-10-CM

## 2013-07-27 DIAGNOSIS — I5022 Chronic systolic (congestive) heart failure: Secondary | ICD-10-CM

## 2013-07-27 DIAGNOSIS — I420 Dilated cardiomyopathy: Secondary | ICD-10-CM

## 2013-07-27 LAB — BASIC METABOLIC PANEL
BUN: 16 mg/dL (ref 6–23)
CO2: 27 mEq/L (ref 19–32)
Calcium: 9.1 mg/dL (ref 8.4–10.5)
Creatinine, Ser: 1.1 mg/dL (ref 0.4–1.5)
Glucose, Bld: 91 mg/dL (ref 70–99)

## 2013-07-27 MED ORDER — SPIRONOLACTONE 25 MG PO TABS: 25.0000 mg | ORAL_TABLET | Freq: Every day | ORAL | Status: DC

## 2013-07-27 NOTE — Patient Instructions (Signed)
START SPIRONOLACTONE 25MG  DAILY  LAB TODAY BMET  LAB TO BE SCHEDULED 08/01/13 BMET  LAB TO BE SCHEDULED 08/08/13 BMET  FOLLOW UP WITH [facility] WEAVER,PA SAME DAY AS 08/08/13 LABS

## 2013-07-27 NOTE — Progress Notes (Signed)
[address] [place], [place]  [address] Phone: [phone] Fax:  [phone]  Date:  07/27/2013   ID:  Brian Hale, DOB 1976/04/07, MRN [id number]  PCP:  METZ,CHRISTINE  Cardiologist:  Dr. Olga Millers     History of Present Illness: Brian Hale is a 37 y.o. male who returns for follow up.  He has a hx of dilated cardiomyopathy, chronic systolic CHF, HTN, lymphoma, asthma, sleep apnea.  He was admitted to the hospital in 03/2013 with acute systolic heart failure. Echocardiogram 5/14:  EF 30-35%,  mild LVH, grade 2 diastolic dysfunction), trivial MR, mod LAE, PASP 42-46 mmHg, trivial eff.  Diuresis was c/b AKI.  Creatinine returned to baseline prior to d/c.  Cardiomyopathy was felt to be most likely related to hypertension. Nuclear study is planned once BP is better controlled.    He was recently seen and placed back on BP meds.  I saw him 07/10/13 and adjusted his beta blocker.  He returns for further medication titration.  The patient denies chest pain, shortness of breath, syncope, orthopnea, PND or significant pedal edema.   Labs (5/14):  Hgb 14.4, TSH 2.122 Labs (8/14):  K 3.7, Cr 1.2  Wt Readings from Last 3 Encounters:  07/27/13 304 lb (137.893 kg)  07/10/13 296 lb (134.265 kg)  06/19/13 306 lb (138.801 kg)     Past Medical History  Diagnosis Date  . Lymphoma in remission   . Asthma   . Hypertension   . OSA (obstructive sleep apnea)   . Cardiomyopathy     Current Outpatient Prescriptions  Medication Sig Dispense Refill  . albuterol (PROVENTIL HFA;VENTOLIN HFA) 108 (90 BASE) MCG/ACT inhaler Inhale 2 puffs into the lungs every 6 (six) hours as needed for wheezing.  1 Inhaler  0  . aspirin 81 MG chewable tablet Chew 1 tablet (81 mg total) by mouth daily.  30 tablet  0  . carvedilol (COREG) 12.5 MG tablet Take 2 tablets (25 mg total) by mouth 2 (two) times daily with a meal.      . fluticasone (FLOVENT HFA) 44 MCG/ACT inhaler Inhale 1 puff into the  lungs 2 (two) times daily.  1 Inhaler  0  . furosemide (LASIX) 20 MG tablet Take 1 tablet (20 mg total) by mouth daily.  30 tablet  12  . lisinopril (PRINIVIL,ZESTRIL) 20 MG tablet Take 1 tablet (20 mg total) by mouth daily.  30 tablet  12   No current facility-administered medications for this visit.    Allergies:   No Known Allergies  Social History:  The patient  reports that he quit smoking about 5 months ago. His smoking use included Cigars. He has never used smokeless tobacco. He reports that he drinks about 21.6 ounces of alcohol per week. He reports that he does not use illicit drugs.   ROS:  Please see the history of present illness. He notes R leg pain in radicular pattern.  All other systems reviewed and negative.   PHYSICAL EXAM: VS:  BP 153/95  Pulse 77  Ht 6' (1.829 m)  Wt 304 lb (137.893 kg)  BMI 41.22 kg/m2 Well nourished, well developed, in no acute distress HEENT: normal Neck: no JVD Cardiac:  normal S1, S2; RRR; no murmur Lungs:  clear to auscultation bilaterally, no wheezing, rhonchi or rales Abd: soft, nontender, no hepatomegaly Ext: no edema Skin: warm and dry Neuro:  CNs 2-12 intact, no focal abnormalities noted  EKG:   NSR, HR  77, RAD, inf-lat TWI, no change from prior tracing     ASSESSMENT AND PLAN:  1. Hypertension:  Better controlled.  Add Spironolactone.  Check BMET today, in 5 days and in 12 days. 2. Chronic Systolic CHF:  Continue beta blocker and ACE inhibitor.  Add Spironolactone as noted.  3. Cardiomyopathy:  Likely related to untreated HTN.  Will plan on myoview once BP better controlled.  Will need repeat Echo once on maximal medical Rx to reassess EF.   4. Obesity:  Probably has OSA.  Will need to address this over time.  5. Disposition: F/u with me in 2 weeks and Dr. Olga Millers in 09/2013 as planned.   Signed, Tereso Newcomer, PA-C  07/27/2013 11:43 AM

## 2013-08-01 ENCOUNTER — Other Ambulatory Visit: Payer: BC Managed Care – PPO

## 2013-08-06 ENCOUNTER — Ambulatory Visit: Payer: BC Managed Care – PPO | Admitting: Internal Medicine

## 2013-08-07 ENCOUNTER — Ambulatory Visit: Payer: BC Managed Care – PPO | Admitting: Internal Medicine

## 2013-08-08 ENCOUNTER — Ambulatory Visit: Payer: BC Managed Care – PPO | Admitting: Physician Assistant

## 2013-08-08 ENCOUNTER — Other Ambulatory Visit: Payer: BC Managed Care – PPO

## 2013-08-21 ENCOUNTER — Other Ambulatory Visit: Payer: BC Managed Care – PPO

## 2013-08-21 ENCOUNTER — Encounter: Payer: Self-pay | Admitting: Physician Assistant

## 2013-08-21 ENCOUNTER — Encounter (INDEPENDENT_AMBULATORY_CARE_PROVIDER_SITE_OTHER): Payer: Self-pay

## 2013-08-21 ENCOUNTER — Ambulatory Visit (INDEPENDENT_AMBULATORY_CARE_PROVIDER_SITE_OTHER): Payer: BC Managed Care – PPO | Admitting: Physician Assistant

## 2013-08-21 VITALS — BP 140/100 | HR 71 | Ht 72.0 in | Wt 303.0 lb

## 2013-08-21 DIAGNOSIS — I5022 Chronic systolic (congestive) heart failure: Secondary | ICD-10-CM

## 2013-08-21 DIAGNOSIS — G4733 Obstructive sleep apnea (adult) (pediatric): Secondary | ICD-10-CM

## 2013-08-21 DIAGNOSIS — I428 Other cardiomyopathies: Secondary | ICD-10-CM

## 2013-08-21 DIAGNOSIS — I10 Essential (primary) hypertension: Secondary | ICD-10-CM

## 2013-08-21 LAB — BASIC METABOLIC PANEL: Potassium: 4.1 mEq/L (ref 3.5–5.1)

## 2013-08-21 NOTE — Patient Instructions (Signed)
START TAKING YOUR COREG, LISINOPRIL, SPIRONOLACTONE, AND LASIX IN THE MORNING AROUND 8 OR 9 AM;  MAKE SURE YOU SPACE OUT THE COREG TO EVERY 12 HOURS APART. (ie COREG 8 AM COREG 8 PM)   YOU NEED TO FOLLOW WITH A BLOOD PRESSURE CHECK WITH THE NURSE TO BE DONE NEXT WEEK   PLEASE SCHEDULE TO HAVE AN EXERCISE MYOVIEW DONE THE FIRST WEEK OF NOV. 2014   MAKE SURE TO KEEP YOUR APPT WITH DR. CRENSHAW 09/11/13

## 2013-08-21 NOTE — Progress Notes (Signed)
[address], [address] [place], [place]  [address] Phone: [phone] Fax:  [phone]  Date:  08/21/2013   ID:  Brian Hale, DOB Nov 10, 1975, MRN [id number]  PCP:  METZ,CHRISTINE  Cardiologist:  Dr. Olga Millers     History of Present Illness: Brian Hale is a 37 y.o. male who returns for follow up.  He has a hx of dilated cardiomyopathy, chronic systolic CHF, HTN, lymphoma, asthma, sleep apnea.  He was admitted to the hospital in 03/2013 with acute systolic heart failure. Echocardiogram 5/14:  EF 30-35%,  mild LVH, grade 2 diastolic dysfunction), trivial MR, mod LAE, PASP 42-46 mmHg, trivial eff.  Diuresis was c/b AKI.  Creatinine returned to baseline prior to d/c.  Cardiomyopathy was felt to be most likely related to hypertension. Nuclear study is planned once BP is better controlled.    Last seen by me 07/27/13.  Medications were adjusted.  The patient denies shortness of breath, syncope, orthopnea, PND or significant pedal edema. He has occasional L chest pain that typically occurs at night while watching TV and lasts seconds.  No assoc symptoms.  No exertional CP.   Labs (5/14):  Hgb 14.4, TSH 2.122 Labs (8/14):  K 3.7, Cr 1.2 Labs (9/14):  K 3.8, Cr 1.1   Wt Readings from Last 3 Encounters:  08/21/13 303 lb (137.44 kg)  07/27/13 304 lb (137.893 kg)  07/10/13 296 lb (134.265 kg)     Past Medical History  Diagnosis Date  . Lymphoma in remission   . Asthma   . Hypertension   . OSA (obstructive sleep apnea)     does not wear CPAP  . Cardiomyopathy     Echocardiogram 5/14:  EF 30-35%,  mild LVH, grade 2 diastolic dysfunction), trivial MR, mod LAE, PASP 42-46 mmHg, trivial eff - ? low EF related to HTN (ischemic eval to be done once BP controlled)  . Obesity   . Chronic systolic CHF (congestive heart failure)     Current Outpatient Prescriptions  Medication Sig Dispense Refill  . albuterol (PROVENTIL HFA;VENTOLIN HFA) 108 (90 BASE) MCG/ACT inhaler Inhale 2  puffs into the lungs every 6 (six) hours as needed for wheezing.  1 Inhaler  0  . aspirin 81 MG chewable tablet Chew 1 tablet (81 mg total) by mouth daily.  30 tablet  0  . carvedilol (COREG) 12.5 MG tablet Take 2 tablets (25 mg total) by mouth 2 (two) times daily with a meal.      . fluticasone (FLOVENT HFA) 44 MCG/ACT inhaler Inhale 1 puff into the lungs 2 (two) times daily.  1 Inhaler  0  . furosemide (LASIX) 20 MG tablet Take 1 tablet (20 mg total) by mouth daily.  30 tablet  12  . lisinopril (PRINIVIL,ZESTRIL) 20 MG tablet Take 1 tablet (20 mg total) by mouth daily.  30 tablet  12  . spironolactone (ALDACTONE) 25 MG tablet Take 1 tablet (25 mg total) by mouth daily.  90 tablet  3   No current facility-administered medications for this visit.    Allergies:   No Known Allergies  Social History:  The patient  reports that he quit smoking about 5 months ago. His smoking use included Cigars. He has never used smokeless tobacco. He reports that he drinks about 21.6 ounces of alcohol per week. He reports that he does not use illicit drugs.   ROS:  Please see the history of present illness.    All other systems reviewed  and negative.   PHYSICAL EXAM: VS:  BP 140/100  Pulse 71  Ht 6' (1.829 m)  Wt 303 lb (137.44 kg)  BMI 41.09 kg/m2 Well nourished, well developed, in no acute distress HEENT: normal Neck: no JVD Cardiac:  normal S1, S2; RRR; no murmur Lungs:  clear to auscultation bilaterally, no wheezing, rhonchi or rales Abd: soft, nontender, no hepatomegaly Ext: no edema Skin: warm and dry Neuro:  CNs 2-12 intact, no focal abnormalities noted  EKG:    NSR, HR 71, T wave inversions in 2, 3, aVF, V4-V6, no change from prior tracing  ASSESSMENT AND PLAN:  1. Hypertension:  He never had his f/u BMET draw.  Check BMET today.  BP remains uncontrolled.  However, he has not had meds yet today.  I will bring him back for a BP check with the RN.  If BP controlled at that time, will proceed  with stress testing prior to f/u with Dr. Olga Millers.  If uncontrolled, will d/c stress test and adjust medications (consider increasing ACEI or adding hydralazine).  2. Chronic Systolic CHF:  Continue beta blocker, ACE inhibitor, Spironolactone.  Volume stable.  Check BMET today.  3. Cardiomyopathy:  Likely related to untreated HTN.  Will plan on myoview as long as BP remains controlled (see above).     4. Obesity:  We discussed the importance of weight loss. 5. Sleep Apnea:  I have encouraged him to use his CPAP.  6. Disposition: F/u with Dr. Olga Millers in 09/2013 as planned.   Signed, Tereso Newcomer, PA-C  08/21/2013 10:07 AM

## 2013-08-28 ENCOUNTER — Ambulatory Visit (INDEPENDENT_AMBULATORY_CARE_PROVIDER_SITE_OTHER): Payer: BC Managed Care – PPO | Admitting: *Deleted

## 2013-08-28 VITALS — BP 158/108 | HR 76 | Ht 72.0 in | Wt 301.1 lb

## 2013-08-28 DIAGNOSIS — N289 Disorder of kidney and ureter, unspecified: Secondary | ICD-10-CM

## 2013-08-28 DIAGNOSIS — I509 Heart failure, unspecified: Secondary | ICD-10-CM

## 2013-08-28 DIAGNOSIS — I10 Essential (primary) hypertension: Secondary | ICD-10-CM

## 2013-08-28 DIAGNOSIS — I428 Other cardiomyopathies: Secondary | ICD-10-CM

## 2013-08-28 DIAGNOSIS — I5021 Acute systolic (congestive) heart failure: Secondary | ICD-10-CM

## 2013-08-28 DIAGNOSIS — I420 Dilated cardiomyopathy: Secondary | ICD-10-CM

## 2013-08-28 DIAGNOSIS — I5031 Acute diastolic (congestive) heart failure: Secondary | ICD-10-CM

## 2013-08-28 MED ORDER — LISINOPRIL 40 MG PO TABS: 40.0000 mg | ORAL_TABLET | Freq: Every day | ORAL | Status: DC

## 2013-08-28 NOTE — Patient Instructions (Signed)
Please increase your Lisinopril to 40 mg a day Continue all other medications as listed.  Your physician has requested that you have a renal artery duplex. During this test, an ultrasound is used to evaluate blood flow to the kidneys. Allow one hour for this exam. Do not eat after midnight the day before and avoid carbonated beverages. Take your medications as you usually do.  Follow up with Dr Jens Som as scheduled.

## 2013-09-04 ENCOUNTER — Other Ambulatory Visit (INDEPENDENT_AMBULATORY_CARE_PROVIDER_SITE_OTHER): Payer: BC Managed Care – PPO

## 2013-09-04 ENCOUNTER — Ambulatory Visit (HOSPITAL_COMMUNITY): Payer: BC Managed Care – PPO

## 2013-09-04 DIAGNOSIS — N289 Disorder of kidney and ureter, unspecified: Secondary | ICD-10-CM

## 2013-09-04 DIAGNOSIS — I10 Essential (primary) hypertension: Secondary | ICD-10-CM

## 2013-09-04 LAB — BASIC METABOLIC PANEL
BUN: 24 mg/dL — ABNORMAL HIGH (ref 6–23)
CO2: 29 mEq/L (ref 19–32)
Chloride: 104 mEq/L (ref 96–112)
Glucose, Bld: 89 mg/dL (ref 70–99)
Potassium: 3.9 mEq/L (ref 3.5–5.1)

## 2013-09-05 ENCOUNTER — Encounter (HOSPITAL_COMMUNITY): Payer: BC Managed Care – PPO

## 2013-09-06 ENCOUNTER — Encounter (HOSPITAL_COMMUNITY): Payer: BC Managed Care – PPO

## 2013-09-11 ENCOUNTER — Ambulatory Visit: Payer: BC Managed Care – PPO | Admitting: Cardiology

## 2013-09-17 ENCOUNTER — Encounter (HOSPITAL_COMMUNITY): Payer: BC Managed Care – PPO

## 2013-09-24 ENCOUNTER — Encounter: Payer: BC Managed Care – PPO | Admitting: Cardiology

## 2013-09-24 ENCOUNTER — Encounter (HOSPITAL_COMMUNITY): Payer: BC Managed Care – PPO

## 2013-09-24 DIAGNOSIS — R0989 Other specified symptoms and signs involving the circulatory and respiratory systems: Secondary | ICD-10-CM

## 2013-09-24 NOTE — Progress Notes (Signed)
HPI: FU CHF; hx of dilated cardiomyopathy, chronic systolic CHF, HTN, lymphoma, asthma, sleep apnea. He was admitted to the hospital in 03/2013 with acute systolic heart failure. Echocardiogram 5/14: EF 30-35%, mild LVH, grade 2 diastolic dysfunction), trivial MR, mod LAE, PASP 42-46 mmHg, trivial eff. Diuresis was c/b AKI. Creatinine returned to baseline prior to d/c. Cardiomyopathy was felt to be most likely related to hypertension. Nuclear study planned once BP is better controlled. Patient has had some issues with compliance. Since she was last seen,    Current Outpatient Prescriptions  Medication Sig Dispense Refill  . albuterol (PROVENTIL HFA;VENTOLIN HFA) 108 (90 BASE) MCG/ACT inhaler Inhale 2 puffs into the lungs every 6 (six) hours as needed for wheezing.  1 Inhaler  0  . aspirin 81 MG chewable tablet Chew 1 tablet (81 mg total) by mouth daily.  30 tablet  0  . carvedilol (COREG) 12.5 MG tablet Take 2 tablets (25 mg total) by mouth 2 (two) times daily with a meal.      . fluticasone (FLOVENT HFA) 44 MCG/ACT inhaler Inhale 1 puff into the lungs 2 (two) times daily.  1 Inhaler  0  . furosemide (LASIX) 20 MG tablet Take 1 tablet (20 mg total) by mouth daily.  30 tablet  12  . lisinopril (PRINIVIL,ZESTRIL) 40 MG tablet Take 1 tablet (40 mg total) by mouth daily.  30 tablet  12  . spironolactone (ALDACTONE) 25 MG tablet Take 1 tablet (25 mg total) by mouth daily.  90 tablet  3   No current facility-administered medications for this visit.     Past Medical History  Diagnosis Date  . Lymphoma in remission   . Asthma   . Hypertension   . OSA (obstructive sleep apnea)     does not wear CPAP  . Cardiomyopathy     Echocardiogram 5/14:  EF 30-35%,  mild LVH, grade 2 diastolic dysfunction), trivial MR, mod LAE, PASP 42-46 mmHg, trivial eff - ? low EF related to HTN (ischemic eval to be done once BP controlled)  . Obesity   . Chronic systolic CHF (congestive heart failure)     Past  Surgical History  Procedure Laterality Date  . Port-a-cath removal      History   Social History  . Marital Status: Single    Spouse Name: N/A    Number of Children: N/A  . Years of Education: N/A   Occupational History  . Not on file.   Social History Main Topics  . Smoking status: Former Smoker -- 0.10 packs/day for 20 years    Types: Cigars    Quit date: 02/22/2013  . Smokeless tobacco: Never Used  . Alcohol Use: 21.6 oz/week    36 Cans of beer per week  . Drug Use: No  . Sexual Activity: No   Other Topics Concern  . Not on file   Social History Narrative  . No narrative on file    ROS: no fevers or chills, productive cough, hemoptysis, dysphasia, odynophagia, melena, hematochezia, dysuria, hematuria, rash, seizure activity, orthopnea, PND, pedal edema, claudication. Remaining systems are negative.  Physical Exam: Well-developed well-nourished in no acute distress.  Skin is warm and dry.  HEENT is normal.  Neck is supple.  Chest is clear to auscultation with normal expansion.  Cardiovascular exam is regular rate and rhythm.  Abdominal exam nontender or distended. No masses palpated. Extremities show no edema. neuro grossly intact  ECG     This  encounter was created in error - please disregard.

## 2014-10-06 ENCOUNTER — Other Ambulatory Visit: Payer: Self-pay | Admitting: Physician Assistant

## 2014-10-07 ENCOUNTER — Other Ambulatory Visit: Payer: Self-pay | Admitting: *Deleted

## 2014-10-07 MED ORDER — FUROSEMIDE 20 MG PO TABS: 20.0000 mg | ORAL_TABLET | Freq: Every day | ORAL | Status: DC

## 2014-10-08 ENCOUNTER — Other Ambulatory Visit: Payer: Self-pay

## 2014-10-08 ENCOUNTER — Encounter: Payer: Self-pay | Admitting: Physician Assistant

## 2014-10-08 ENCOUNTER — Ambulatory Visit (INDEPENDENT_AMBULATORY_CARE_PROVIDER_SITE_OTHER): Payer: BC Managed Care – PPO | Admitting: Physician Assistant

## 2014-10-08 VITALS — BP 160/100 | HR 81 | Ht 72.0 in | Wt 316.0 lb

## 2014-10-08 DIAGNOSIS — I10 Essential (primary) hypertension: Secondary | ICD-10-CM

## 2014-10-08 DIAGNOSIS — I429 Cardiomyopathy, unspecified: Secondary | ICD-10-CM

## 2014-10-08 DIAGNOSIS — N289 Disorder of kidney and ureter, unspecified: Secondary | ICD-10-CM

## 2014-10-08 DIAGNOSIS — I5031 Acute diastolic (congestive) heart failure: Secondary | ICD-10-CM

## 2014-10-08 DIAGNOSIS — I420 Dilated cardiomyopathy: Secondary | ICD-10-CM

## 2014-10-08 DIAGNOSIS — I5022 Chronic systolic (congestive) heart failure: Secondary | ICD-10-CM

## 2014-10-08 DIAGNOSIS — R0600 Dyspnea, unspecified: Secondary | ICD-10-CM

## 2014-10-08 DIAGNOSIS — I5021 Acute systolic (congestive) heart failure: Secondary | ICD-10-CM

## 2014-10-08 DIAGNOSIS — G4733 Obstructive sleep apnea (adult) (pediatric): Secondary | ICD-10-CM

## 2014-10-08 DIAGNOSIS — Z720 Tobacco use: Secondary | ICD-10-CM

## 2014-10-08 MED ORDER — HYDRALAZINE HCL 25 MG PO TABS: 25.0000 mg | ORAL_TABLET | Freq: Three times a day (TID) | ORAL | Status: DC

## 2014-10-08 MED ORDER — CARVEDILOL 12.5 MG PO TABS: 25.0000 mg | ORAL_TABLET | Freq: Two times a day (BID) | ORAL | Status: DC

## 2014-10-08 MED ORDER — LISINOPRIL 40 MG PO TABS
40.0000 mg | ORAL_TABLET | Freq: Every day | ORAL | Status: DC
Start: 2014-10-08 — End: 2015-05-30

## 2014-10-08 NOTE — Progress Notes (Signed)
Cardiology Office Note   Date:  10/08/2014   ID:  Brian Hale, DOB 02/14/1976, MRN [id number]  PCP:  METZ,CHRISTINE  Cardiologist:  Dr. Kirk Ruths     History of Present Illness: Brian Hale is a 38 y.o. male with a hx of dilated cardiomyopathy, chronic systolic CHF, HTN, lymphoma, asthma, sleep apnea. He was admitted to the hospital in 03/2013 with acute systolic heart failure. Echocardiogram 5/14: EF 30-35%, mild LVH, grade 2 diastolic dysfunction), trivial MR, mod LAE, PASP 42-46 mmHg, trivial eff. Diuresis was c/b AKI. Creatinine returned to baseline prior to d/c. Cardiomyopathy was felt to be most likely related to hypertension. Nuclear study was planned once BP is better controlled. Nuclear stress test and renal artery [place] (to r/o RAS) was never done.  He was last seen in this office in 08/2013.    He returns for follow-up. He has overall been doing well. He did run out of Lasix for several days. He had some lower back pain radiating into his groin during this time. He denies dysuria or hematuria. Symptoms have gradually resolved. He had worsening symptoms with positional changes. He denies radicular symptoms. He denies chest pain, syncope. He does note some dyspnea with moderate activities. He is NYHA 2-2b. He sleeps on an incline chronically. He denies PND. He uses his CPAP sporadically. He denies significant LE edema.  He continues to smoke on occasion.  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  He would like to start increasing his activity.  Recent Labs: No results found for requested labs within last 365 days.    Recent Radiology: No results found.    Wt Readings from Last 3 Encounters:  10/08/14 316 lb (143.337 kg)  08/28/13 301 lb 1.9 oz (136.587 kg)  08/21/13 303 lb (137.44 kg)     Past Medical History  Diagnosis Date  . Lymphoma in remission   . Asthma   . Hypertension   . OSA (obstructive sleep apnea)     does not wear CPAP  .  Cardiomyopathy     Echocardiogram 5/14:  EF 30-35%,  mild LVH, grade 2 diastolic dysfunction), trivial MR, mod LAE, PASP 42-46 mmHg, trivial eff - ? low EF related to HTN (ischemic eval to be done once BP controlled)  . Obesity   . Chronic systolic CHF (congestive heart failure)     Current Outpatient Prescriptions  Medication Sig Dispense Refill  . aspirin 81 MG chewable tablet Chew 1 tablet (81 mg total) by mouth daily. 30 tablet 0  . carvedilol (COREG) 12.5 MG tablet Take 2 tablets (25 mg total) by mouth 2 (two) times daily with a meal. 60 tablet 2  . furosemide (LASIX) 20 MG tablet Take 1 tablet (20 mg total) by mouth daily. 30 tablet 0  . lisinopril (PRINIVIL,ZESTRIL) 40 MG tablet Take 1 tablet (40 mg total) by mouth daily. 30 tablet 1  . spironolactone (ALDACTONE) 25 MG tablet TAKE 1 TABLET BY MOUTH ONCE DAILY 30 tablet 0   No current facility-administered medications for this visit.     Allergies:   Review of patient's allergies indicates no known allergies.   Social History:  The patient  reports that he quit smoking about 19 months ago. His smoking use included Cigars. He has never used smokeless tobacco. He reports that he drinks about 21.6 oz of alcohol per week. He reports that he does not use illicit drugs. he works on a golf course  Family History:  The patient's family history includes Heart attack in his maternal grandmother, paternal grandfather, and paternal grandmother; Heart disease in an other family member; Hypertension in his brother, father, and mother; Stroke in his father.    ROS:  Please see the history of present illness.      All other systems reviewed and negative.    PHYSICAL EXAM: VS:  BP 160/100 mmHg  Pulse 81  Ht 6' (1.829 m)  Wt 316 lb (143.337 kg)  BMI 42.85 kg/m2  SpO2 97% Well nourished, well developed, in no acute distress HEENT: normal Neck: no JVD Cardiac:  normal S1, S2;  RRR; no murmur   Lungs:   clear to auscultation bilaterally, no  wheezing, rhonchi or rales Abd: soft, nontender, no hepatomegaly Ext: no edema Skin: warm and dry Neuro:  CNs 2-12 intact, no focal abnormalities noted  EKG:  NSR, HR 83, normal axis, biatrial enlargement, nonspecific ST-T wave changes, no change from prior tracings      ASSESSMENT AND PLAN:  1.  Essential hypertension:  BP is uncontrolled.  He does not take PDE-5 inhibitors.  But, he has had issues with ED in the past and thinks he may ask his PCP about this.      -  Continue beta blocker, ACEI, Spironolactone    -  Add Hydralazine 25 mg tid.  I would avoid nitrates for now as he may use PDE-5 inhibitors in the future.    -  Consider Amlodipine in the future    -  Arrange Renal artery duplex.    -  Hold off on increasing exercise until BP is better controlled.  I d/w patient today. 2.  Cardiomyopathy:  This was felt to be related to uncontrolled hypertension in the past. He has never had ischemic workup. He does have dyspnea with exertion.    -  Arrange Lexiscan Myoview    -  Continue beta blocker, ACE inhibitor, spironolactone    -  Add hydralazine as noted  3.  Chronic systolic CHF (congestive heart failure):  Volume appears stable. Continue current dose of Lasix. He does note some dyspnea on exertion.     -  Check BMET, BNP 4.  OSA (obstructive sleep apnea):  I have encouraged him to use CPAP. 5.  Tobacco abuse:  I have encouraged him to quit.    Disposition:   FU with me 4-6 weeks.    Signed, Versie Starks, MHS 10/08/2014 4:20 PM    [facility] [address], [place], [place]  [address] Phone: [phone]; Fax: [phone]

## 2014-10-08 NOTE — Patient Instructions (Signed)
START HYDRALAZINE 25 MG 1 TABLET 3 TIMES DAILY; NEW RX SENT IN TODAY  Your physician has requested that you have a renal artery duplex. During this test, an ultrasound is used to evaluate blood flow to the kidneys. Allow one hour for this exam. Do not eat after midnight the day before and avoid carbonated beverages. Take your medications as you usually do.  Your physician has requested that you have a lexiscan myoview. For further information please visit [email or web address]. Please follow instruction sheet, as given.  Your physician recommends that you return for lab work in: [place], BNP  Your physician recommends that you schedule a follow-up appointment in: [place], [facility]

## 2014-10-09 LAB — BASIC METABOLIC PANEL
BUN: 27 mg/dL — ABNORMAL HIGH (ref 6–23)
CHLORIDE: 104 meq/L (ref 96–112)
CO2: 28 mEq/L (ref 19–32)
Calcium: 9.2 mg/dL (ref 8.4–10.5)
Creatinine, Ser: 1.4 mg/dL (ref 0.4–1.5)
GFR: 73.62 mL/min (ref >60.00)
Glucose, Bld: 91 mg/dL (ref 70–99)
POTASSIUM: 4 meq/L (ref 3.5–5.1)
SODIUM: 139 meq/L (ref 135–145)

## 2014-10-09 LAB — BRAIN NATRIURETIC PEPTIDE: PRO B NATRI PEPTIDE: 316 pg/mL — AB (ref 0.0–100.0)

## 2014-10-11 ENCOUNTER — Telehealth: Payer: Self-pay | Admitting: *Deleted

## 2014-10-11 ENCOUNTER — Encounter (HOSPITAL_COMMUNITY): Payer: BC Managed Care – PPO

## 2014-10-11 ENCOUNTER — Other Ambulatory Visit: Payer: Self-pay | Admitting: *Deleted

## 2014-10-11 ENCOUNTER — Ambulatory Visit (HOSPITAL_COMMUNITY): Payer: BC Managed Care – PPO | Attending: Cardiovascular Disease | Admitting: Radiology

## 2014-10-11 DIAGNOSIS — I429 Cardiomyopathy, unspecified: Secondary | ICD-10-CM

## 2014-10-11 DIAGNOSIS — R0600 Dyspnea, unspecified: Secondary | ICD-10-CM | POA: Insufficient documentation

## 2014-10-11 DIAGNOSIS — I10 Essential (primary) hypertension: Secondary | ICD-10-CM | POA: Insufficient documentation

## 2014-10-11 DIAGNOSIS — I5031 Acute diastolic (congestive) heart failure: Secondary | ICD-10-CM

## 2014-10-11 DIAGNOSIS — Z136 Encounter for screening for cardiovascular disorders: Secondary | ICD-10-CM | POA: Diagnosis not present

## 2014-10-11 DIAGNOSIS — R002 Palpitations: Secondary | ICD-10-CM | POA: Insufficient documentation

## 2014-10-11 DIAGNOSIS — R0602 Shortness of breath: Secondary | ICD-10-CM

## 2014-10-11 DIAGNOSIS — N289 Disorder of kidney and ureter, unspecified: Secondary | ICD-10-CM

## 2014-10-11 MED ORDER — TECHNETIUM TC 99M SESTAMIBI GENERIC - CARDIOLITE
33.0000 | Freq: Once | INTRAVENOUS | Status: AC | PRN
  Administered 2014-10-11: 33 via INTRAVENOUS

## 2014-10-11 MED ORDER — REGADENOSON 0.4 MG/5ML IV SOLN
0.4000 mg | Freq: Once | INTRAVENOUS | Status: AC
  Administered 2014-10-11: 0.4 mg via INTRAVENOUS

## 2014-10-11 NOTE — Telephone Encounter (Signed)
lmptcb x 3 to go over results. Pt is here in the office now for his myoview. I will give results to pt today while he is here today for test.

## 2014-10-11 NOTE — Progress Notes (Signed)
[facility] [address] [place], [place] [address] [phone]    Cardiology Nuclear Med Study  LEVANTE SIMONES is a 38 y.o. male     MRN : [id number]     DOB: 1976-06-26  Procedure Date: 10/11/2014  Nuclear Med Background Indication for Stress Test:  Evaluation for Ischemia History:  No prior known history of CAD, CHF, and CM Cardiac Risk Factors: Hypertension  Symptoms:  DOE and Palpitations   Nuclear Pre-Procedure Caffeine/Decaff Intake:  None> 12 hrs NPO After: 10:30pm   Lungs:  clear O2 Sat: 98% on room air. IV 0.9% NS with Angio Cath:  22g  IV Site: R Hand x 1, tolerated well IV Started by:  Irven Baltimore, RN  Chest Size (in):  54 Cup Size: n/a  Height: 6' (1.829 m)  Weight:  314 lb (142.429 kg)  BMI:  Body mass index is 42.58 kg/(m^2). Tech Comments:  No medications today. Patient held Coreg x 24 hrs. Irven Baltimore, RN.    Nuclear Med Study 1 or 2 day study: 2 day  Stress Test Type:  Carlton Adam  Reading MD: N/A  Order Authorizing Provider:  Kirk Ruths, MD, and Richardson Dopp, PAC.  Resting Radionuclide: Technetium 14m Sestamibi  Resting Radionuclide Dose: 33.0 mCi on 10/14/14   Stress Radionuclide:  Technetium 18m Sestamibi  Stress Radionuclide Dose: 33.0 mCi on 10/11/14           Stress Protocol Rest HR: 76 Stress HR: 103  Rest BP: 141/93 Stress BP: 159/92  Exercise Time (min): n/a METS: n/a   Predicted Max HR: 181 bpm % Max HR: 56.91 bpm Rate Pressure Product: 16377   Dose of Adenosine (mg):  n/a Dose of Lexiscan: 0.4 mg  Dose of Atropine (mg): n/a Dose of Dobutamine: n/a mcg/kg/min (at max HR)  Stress Test Technologist: Irven Baltimore, RN  Nuclear Technologist:  Earl Many, CNMT      Rest Procedure:  Myocardial perfusion imaging was performed at rest 45 minutes following the intravenous administration of Technetium 109m Sestamibi. Rest ECG: SR LVH   Stress Procedure:  The patient received IV Lexiscan 0.4 mg over  15-seconds.  Technetium 62m Sestamibi injected at 30-seconds.  The patient complained of SOB and warm sensation, but denied chest pain with Lexiscan.Quantitative spect images were obtained after a 45 minute delay. Stress ECG: No significant change from baseline ECG  QPS Raw Data Images:  Patient motion noted. Stress Images:  Normal homogeneous uptake in all areas of the myocardium. Rest Images:  Normal homogeneous uptake in all areas of the myocardium. Subtraction (SDS):  No evidence of ischemia. Transient Ischemic Dilatation (Normal <1.22):  0.99 Lung/Heart Ratio (Normal <0.45):  0.32  Quantitative Gated Spect Images QGS EDV:  283 ml QGS ESV:  210 ml  Impression Exercise Capacity:  Lexiscan with no exercise. BP Response:  Normal blood pressure response. Clinical Symptoms:  There is dyspnea. ECG Impression:  No significant ST segment change suggestive of ischemia. Comparison with Prior Nuclear Study: No previous nuclear study performed  Overall Impression:  Intermediate risk stress nuclear study Normal perfusion but severe LVE with diffuse hypokinesis and severely reduced EF consistant with non ischemic DCM.  LV Ejection Fraction: 26%.  LV Wall Motion:  Severely reduced diffuse hypokinesis   Brian Hale

## 2014-10-14 ENCOUNTER — Ambulatory Visit (HOSPITAL_COMMUNITY): Payer: BC Managed Care – PPO | Attending: Cardiology | Admitting: Cardiology

## 2014-10-14 ENCOUNTER — Ambulatory Visit (HOSPITAL_BASED_OUTPATIENT_CLINIC_OR_DEPARTMENT_OTHER): Payer: BC Managed Care – PPO

## 2014-10-14 DIAGNOSIS — I10 Essential (primary) hypertension: Secondary | ICD-10-CM | POA: Diagnosis not present

## 2014-10-14 DIAGNOSIS — N289 Disorder of kidney and ureter, unspecified: Secondary | ICD-10-CM | POA: Diagnosis not present

## 2014-10-14 DIAGNOSIS — F17210 Nicotine dependence, cigarettes, uncomplicated: Secondary | ICD-10-CM | POA: Insufficient documentation

## 2014-10-14 DIAGNOSIS — R0989 Other specified symptoms and signs involving the circulatory and respiratory systems: Secondary | ICD-10-CM

## 2014-10-14 MED ORDER — TECHNETIUM TC 99M SESTAMIBI GENERIC - CARDIOLITE
33.0000 | Freq: Once | INTRAVENOUS | Status: AC | PRN
  Administered 2014-10-14: 33 via INTRAVENOUS

## 2014-10-14 NOTE — Progress Notes (Signed)
Renal artery duplex performed  

## 2014-10-15 ENCOUNTER — Encounter: Payer: Self-pay | Admitting: Physician Assistant

## 2014-10-16 ENCOUNTER — Telehealth: Payer: Self-pay | Admitting: *Deleted

## 2014-10-16 ENCOUNTER — Encounter: Payer: Self-pay | Admitting: Physician Assistant

## 2014-10-16 NOTE — Telephone Encounter (Signed)
F/U      Pt returning phn call. Please call back.

## 2014-10-16 NOTE — Telephone Encounter (Signed)
pt notified about renal duplex no stenosis. Pt verbalized understanding .

## 2014-10-16 NOTE — Telephone Encounter (Signed)
lmptcb to go over renal duplex results

## 2014-10-18 ENCOUNTER — Other Ambulatory Visit: Payer: BC Managed Care – PPO

## 2014-11-20 ENCOUNTER — Ambulatory Visit: Payer: BC Managed Care – PPO | Admitting: Physician Assistant

## 2014-12-07 ENCOUNTER — Other Ambulatory Visit: Payer: Self-pay | Admitting: Cardiology

## 2015-05-24 ENCOUNTER — Other Ambulatory Visit: Payer: Self-pay | Admitting: Cardiology

## 2015-05-26 ENCOUNTER — Other Ambulatory Visit: Payer: Self-pay

## 2015-05-26 MED ORDER — FUROSEMIDE 20 MG PO TABS: 20.0000 mg | ORAL_TABLET | Freq: Every day | ORAL | Status: DC

## 2015-05-30 ENCOUNTER — Other Ambulatory Visit: Payer: Self-pay | Admitting: Cardiology

## 2015-06-30 ENCOUNTER — Other Ambulatory Visit: Payer: Self-pay | Admitting: Cardiology

## 2015-07-15 ENCOUNTER — Encounter (HOSPITAL_COMMUNITY): Payer: Self-pay | Admitting: *Deleted

## 2015-07-15 ENCOUNTER — Emergency Department (HOSPITAL_COMMUNITY)
Admission: EM | Admit: 2015-07-15 | Discharge: 2015-07-16 | Disposition: A | Discharge status: Home / Self Care | Payer: Self-pay | Attending: Emergency Medicine | Admitting: Emergency Medicine

## 2015-07-15 DIAGNOSIS — R011 Cardiac murmur, unspecified: Secondary | ICD-10-CM | POA: Insufficient documentation

## 2015-07-15 DIAGNOSIS — J45901 Unspecified asthma with (acute) exacerbation: Secondary | ICD-10-CM | POA: Insufficient documentation

## 2015-07-15 DIAGNOSIS — Z8572 Personal history of non-Hodgkin lymphomas: Secondary | ICD-10-CM | POA: Insufficient documentation

## 2015-07-15 DIAGNOSIS — Z79899 Other long term (current) drug therapy: Secondary | ICD-10-CM | POA: Insufficient documentation

## 2015-07-15 DIAGNOSIS — I5021 Acute systolic (congestive) heart failure: Secondary | ICD-10-CM | POA: Insufficient documentation

## 2015-07-15 DIAGNOSIS — Z720 Tobacco use: Secondary | ICD-10-CM | POA: Insufficient documentation

## 2015-07-15 DIAGNOSIS — Z7982 Long term (current) use of aspirin: Secondary | ICD-10-CM | POA: Insufficient documentation

## 2015-07-15 DIAGNOSIS — E669 Obesity, unspecified: Secondary | ICD-10-CM | POA: Insufficient documentation

## 2015-07-15 DIAGNOSIS — Z8669 Personal history of other diseases of the nervous system and sense organs: Secondary | ICD-10-CM | POA: Insufficient documentation

## 2015-07-15 DIAGNOSIS — J810 Acute pulmonary edema: Secondary | ICD-10-CM | POA: Insufficient documentation

## 2015-07-15 NOTE — ED Notes (Signed)
Pt reports SOB today with dizziness at times.  Pt reports he was seen here in May and was admitted to the hospital for 5 days for CHF.  Pt denies noticing any swelling in his ankles at this time or cp.

## 2015-07-16 ENCOUNTER — Emergency Department (HOSPITAL_COMMUNITY): Payer: Self-pay

## 2015-07-16 ENCOUNTER — Telehealth: Payer: Self-pay | Admitting: Physician Assistant

## 2015-07-16 ENCOUNTER — Other Ambulatory Visit (HOSPITAL_COMMUNITY): Payer: Self-pay

## 2015-07-16 LAB — CBC WITH DIFFERENTIAL/PLATELET
BASOS ABS: 0 10*3/uL (ref 0.0–0.1)
BASOS PCT: 0 %
EOS ABS: 0.3 10*3/uL (ref 0.0–0.7)
EOS PCT: 4 %
HCT: 42.2 % (ref 39.0–52.0)
Hemoglobin: 13.8 g/dL (ref 13.0–17.0)
LYMPHS PCT: 26 %
Lymphs Abs: 2 10*3/uL (ref 0.7–4.0)
MCH: 28.5 pg (ref 26.0–34.0)
MCHC: 32.7 g/dL (ref 30.0–36.0)
MCV: 87.2 fL (ref 78.0–100.0)
MONO ABS: 0.5 10*3/uL (ref 0.1–1.0)
Monocytes Relative: 7 %
Neutro Abs: 4.8 10*3/uL (ref 1.7–7.7)
Neutrophils Relative %: 63 %
PLATELETS: 256 10*3/uL (ref 150–400)
RBC: 4.84 MIL/uL (ref 4.22–5.81)
RDW: 14.1 % (ref 11.5–15.5)
WBC: 7.7 10*3/uL (ref 4.0–10.5)

## 2015-07-16 LAB — BASIC METABOLIC PANEL
ANION GAP: 8 (ref 5–15)
BUN: 23 mg/dL — AB (ref 6–20)
CO2: 26 mmol/L (ref 22–32)
CREATININE: 1.32 mg/dL — AB (ref 0.61–1.24)
Calcium: 9.4 mg/dL (ref 8.9–10.3)
Chloride: 107 mmol/L (ref 101–111)
GFR calc non Af Amer: >60 mL/min (ref >60)
Glucose, Bld: 100 mg/dL — ABNORMAL HIGH (ref 65–99)
POTASSIUM: 3.6 mmol/L (ref 3.5–5.1)
SODIUM: 141 mmol/L (ref 135–145)

## 2015-07-16 LAB — BRAIN NATRIURETIC PEPTIDE: B Natriuretic Peptide: 746.1 pg/mL — ABNORMAL HIGH (ref 0.0–100.0)

## 2015-07-16 LAB — TROPONIN I: Troponin I: <0.03 ng/mL (ref <0.031)

## 2015-07-16 MED ORDER — FUROSEMIDE 40 MG PO TABS
40.0000 mg | ORAL_TABLET | Freq: Once | ORAL | Status: AC
Start: 2015-07-16 — End: 2015-07-16
  Administered 2015-07-16: 40 mg via ORAL
  Filled 2015-07-16: qty 1

## 2015-07-16 MED ORDER — HYDRALAZINE HCL 20 MG/ML IJ SOLN
10.0000 mg | Freq: Once | INTRAMUSCULAR | Status: AC
  Administered 2015-07-16: 10 mg via INTRAVENOUS
  Filled 2015-07-16: qty 1

## 2015-07-16 MED ORDER — FUROSEMIDE 10 MG/ML IJ SOLN
20.0000 mg | Freq: Once | INTRAMUSCULAR | Status: AC
  Administered 2015-07-16: 20 mg via INTRAVENOUS
  Filled 2015-07-16: qty 4

## 2015-07-16 MED ORDER — FUROSEMIDE 10 MG/ML IJ SOLN
20.0000 mg | Freq: Once | INTRAMUSCULAR | Status: DC
  Filled 2015-07-16: qty 4

## 2015-07-16 NOTE — Telephone Encounter (Signed)
TOC pt.  Pt will be discharged from the ER today per ER MD progress note for CHF exacerbation.  Triage to place a TOC call to the pt on 07/17/15.

## 2015-07-16 NOTE — Telephone Encounter (Signed)
Per Trish  toc 9/16 @ 9 am w/ Gaspar Bidding

## 2015-07-16 NOTE — ED Provider Notes (Signed)
CSN: [id number]     Arrival date & time 07/15/15  2332 History  This chart was scribed for Varney Biles, MD by Randa Evens, ED Scribe. This patient was seen in room WA01/WA01 and the patient's care was started at 1:29 AM.      Chief Complaint  Patient presents with  . Shortness of Breath   The history is provided by the patient. No language interpreter was used.   HPI Comments: Brian Hale is a 39 y.o. male with PMHx CHFwho presents to the Emergency Department complaining of SOB onset several weeks prior that was recently worse today. Pt states that he has some slight associated leg swelling. Pt states that the SOB is worse with exertion. Pt states that walking short distances today his SOB was worse. Pt states that his SOB is better when at resting. Pt states that he is not complaint with taking his medications, he states that he takes them sporadically. Pt states that he is not able to lay lat when sleeping. Pt denies CP.  Past Medical History  Diagnosis Date  . Lymphoma in remission   . Asthma   . Malignant hypertension with congestive heart failure     RA ultrasound (12/15):  no RAS  . OSA (obstructive sleep apnea)     does not wear CPAP  . Cardiomyopathy     Echocardiogram 5/14:  EF 30-35%,  mild LVH, grade 2 diastolic dysfunction), trivial MR, mod LAE, PASP 42-46 mmHg, trivial eff - ? low EF related to HTN (ischemic eval to be done once BP controlled)  . Obesity   . Chronic systolic CHF (congestive heart failure)   . Hx of cardiovascular stress test     Lexiscan Myoview (12/15):  No ischemia, EF 25%, Intermediate Risk   Past Surgical History  Procedure Laterality Date  . Port-a-cath removal     Family History  Problem Relation Age of Onset  . Heart disease      No family history  . Heart attack Paternal Grandmother   . Heart attack Paternal Grandfather   . Heart attack Maternal Grandmother   . Stroke Father   . Hypertension Mother   . Hypertension Father   .  Hypertension Brother    Social History  Substance Use Topics  . Smoking status: Current Every Day Smoker -- 0.10 packs/day for 20 years    Types: Cigars, Cigarettes    Last Attempt to Quit: 02/22/2013  . Smokeless tobacco: Never Used  . Alcohol Use: 21.6 oz/week    36 Cans of beer per week    Review of Systems  Respiratory: Positive for shortness of breath.   Cardiovascular: Negative for chest pain and leg swelling.  All other systems reviewed and are negative.    Allergies  Review of patient's allergies indicates no known allergies.  Home Medications   Prior to Admission medications   Medication Sig Start Date End Date Taking? Authorizing Provider  albuterol (PROVENTIL HFA;VENTOLIN HFA) 108 (90 BASE) MCG/ACT inhaler Inhale 1-2 puffs into the lungs every 6 (six) hours as needed for wheezing or shortness of breath.   Yes Historical Provider, MD  aspirin 81 MG chewable tablet Chew 1 tablet (81 mg total) by mouth daily. 03/20/13  Yes Velvet Bathe, MD  carvedilol (COREG) 12.5 MG tablet TAKE 2 TABLETS BY MOUTH 2 TIMES DAILY WITH A MEAL 06/30/15  Yes Lelon Perla, MD  furosemide (LASIX) 20 MG tablet Take 1 tablet (20 mg total) by mouth daily. 05/26/15  Yes Liliane Shi, PA-C  hydrALAZINE (APRESOLINE) 25 MG tablet Take 1 tablet (25 mg total) by mouth 3 (three) times daily. 10/08/14  Yes Scott T Kathlen Mody, PA-C  lisinopril (PRINIVIL,ZESTRIL) 40 MG tablet TAKE 1 TABLET BY MOUTH EVERY DAY 06/02/15  Yes Lelon Perla, MD  spironolactone (ALDACTONE) 25 MG tablet TAKE 1 TABLET BY MOUTH ONCE DAILY *PT NEEDS APT FOR FURTHER REFILLS* 12/09/14  Yes Lelon Perla, MD   BP 134/86 mmHg  Pulse 88  Temp(Src) 98.1 F (36.7 C) (Oral)  Resp 20  Ht 6' (1.829 m)  Wt 308 lb (139.708 kg)  BMI 41.76 kg/m2  SpO2 100%   Physical Exam  Constitutional: He appears well-developed and well-nourished.  HENT:  Head: Normocephalic and atraumatic.  Eyes: Conjunctivae are normal. Right eye exhibits no  discharge. Left eye exhibits no discharge.  Neck: JVD present.  Cardiovascular: Normal rate and regular rhythm.   Murmur heard. Pulmonary/Chest: Effort normal. No respiratory distress. He has wheezes. He has rales.  Bi basilar wheezing/Rales.   Musculoskeletal: He exhibits edema.  Trace edema in the ankle bilaterally.   Neurological: He is alert. Coordination normal.  Skin: Skin is warm and dry. No rash noted. He is not diaphoretic. No erythema.  Psychiatric: He has a normal mood and affect.  Nursing note and vitals reviewed.   ED Course  Procedures (including critical care time) DIAGNOSTIC STUDIES: Oxygen Saturation is 96% on RA, adequate by my interpretation.    COORDINATION OF CARE: 1:41 AM-Discussed treatment plan with pt at bedside and pt agreed to plan.     Labs Review Labs Reviewed  BASIC METABOLIC PANEL - Abnormal; Notable for the following:    Glucose, Bld 100 (*)    BUN 23 (*)    Creatinine, Ser 1.32 (*)    All other components within normal limits  BRAIN NATRIURETIC PEPTIDE - Abnormal; Notable for the following:    B Natriuretic Peptide 746.1 (*)    All other components within normal limits  CBC WITH DIFFERENTIAL/PLATELET  TROPONIN I    Imaging Review Dg Chest 2 View  07/16/2015   CLINICAL DATA:  Initial evaluation for acute shortness of breath. History of CHF.  EXAM: CHEST  2 VIEW  COMPARISON:  Prior radiograph from 03/15/2013  FINDINGS: Cardiomegaly is stable from previous. Mediastinal silhouette within normal limits.  Lungs are normally inflated. Central pulmonary vascular congestion with diffuse it indistinctness of the interstitial markings, suggestive of mild diffuse pulmonary edema. No pleural effusion. No infiltrate. No pneumothorax.  No acute osseus abnormality.  IMPRESSION: Cardiomegaly with mild diffuse pulmonary edema, suggestive of early/ developing acute CHF exacerbation.   Electronically Signed   By: Jeannine Boga M.D.   On: 07/16/2015 00:23      EKG Interpretation   Date/Time:  Tuesday July 15 2015 23:45:27 EDT Ventricular Rate:  83 PR Interval:  170 QRS Duration: 98 QT Interval:  387 QTC Calculation: 455 R Axis:   109 Text Interpretation:  Sinus rhythm Consider right atrial enlargement Right  axis deviation Borderline T wave abnormalities No acute changes Confirmed  by Kathrynn Humble, MD, Thelma Comp [phone]) on 07/16/2015 1:36:20 AM      MDM   Final diagnoses:  Acute systolic congestive heart failure  Acute pulmonary edema    I personally performed the services described in this documentation, which was scribed in my presence. The recorded information has been reviewed and is accurate.  Pt comes in with cc of shortness of breath. Pt has hx of  CHF, and the sx are consistent with mild chf exacerbation. Pt admits to non compliance with his meds. CXR shows pulm edema.  Pt given lasix 20 ivp. He had > 1 liter urine output.  PT ambulated, and had nio hypoxia. Asked to double the lasix dose for this week, and to see his Cardiology doctors. I have emailed the Cards team about the patient.     Varney Biles, MD 07/16/15 0630

## 2015-07-16 NOTE — Discharge Instructions (Signed)
TAKE YOUR MEDS AS PRESCRIBED EXCEPT LASIX - WHICH WE WANT YOU TO DOUBLE THE DOSE OFF.  See the cardiologist soon. Return to the ER if the symptoms, breathing gets worse.   Heart Failure Heart failure is a condition in which the heart has trouble pumping blood. This means your heart does not pump blood efficiently for your body to work well. In some cases of heart failure, fluid may back up into your lungs or you may have swelling (edema) in your lower legs. Heart failure is usually a long-term (chronic) condition. It is important for you to take good care of yourself and follow your health care provider's treatment plan. CAUSES  Some health conditions can cause heart failure. Those health conditions include:  High blood pressure (hypertension). Hypertension causes the heart muscle to work harder than normal. When pressure in the blood vessels is high, the heart needs to pump (contract) with more force in order to circulate blood throughout the body. High blood pressure eventually causes the heart to become stiff and weak.  Coronary artery disease (CAD). CAD is the buildup of cholesterol and fat (plaque) in the arteries of the heart. The blockage in the arteries deprives the heart muscle of oxygen and blood. This can cause chest pain and may lead to a heart attack. High blood pressure can also contribute to CAD.  Heart attack (myocardial infarction). A heart attack occurs when one or more arteries in the heart become blocked. The loss of oxygen damages the muscle tissue of the heart. When this happens, part of the heart muscle dies. The injured tissue does not contract as well and weakens the heart's ability to pump blood.  Abnormal heart valves. When the heart valves do not open and close properly, it can cause heart failure. This makes the heart muscle pump harder to keep the blood flowing.  Heart muscle disease (cardiomyopathy or myocarditis). Heart muscle disease is damage to the heart muscle  from a variety of causes. These can include drug or alcohol abuse, infections, or unknown reasons. These can increase the risk of heart failure.  Lung disease. Lung disease makes the heart work harder because the lungs do not work properly. This can cause a strain on the heart, leading it to fail.  Diabetes. Diabetes increases the risk of heart failure. High blood sugar contributes to high fat (lipid) levels in the blood. Diabetes can also cause slow damage to tiny blood vessels that carry important nutrients to the heart muscle. When the heart does not get enough oxygen and food, it can cause the heart to become weak and stiff. This leads to a heart that does not contract efficiently.  Other conditions can contribute to heart failure. These include abnormal heart rhythms, thyroid problems, and low blood counts (anemia). Certain unhealthy behaviors can increase the risk of heart failure, including:  Being overweight.  Smoking or chewing tobacco.  Eating foods high in fat and cholesterol.  Abusing illicit drugs or alcohol.  Lacking physical activity. SYMPTOMS  Heart failure symptoms may vary and can be hard to detect. Symptoms may include:  Shortness of breath with activity, such as climbing stairs.  Persistent cough.  Swelling of the feet, ankles, legs, or abdomen.  Unexplained weight gain.  Difficulty breathing when lying flat (orthopnea).  Waking from sleep because of the need to sit up and get more air.  Rapid heartbeat.  Fatigue and loss of energy.  Feeling light-headed, dizzy, or close to fainting.  Loss of appetite.  Nausea.  Increased urination during the night (nocturia). DIAGNOSIS  A diagnosis of heart failure is based on your history, symptoms, physical examination, and diagnostic tests. Diagnostic tests for heart failure may include:  Echocardiography.  Electrocardiography.  Chest X-ray.  Blood tests.  Exercise stress test.  Cardiac  angiography.  Radionuclide scans. TREATMENT  Treatment is aimed at managing the symptoms of heart failure. Medicines, behavioral changes, or surgical intervention may be necessary to treat heart failure.  Medicines to help treat heart failure may include:  Angiotensin-converting enzyme (ACE) inhibitors. This type of medicine blocks the effects of a blood protein called angiotensin-converting enzyme. ACE inhibitors relax (dilate) the blood vessels and help lower blood pressure.  Angiotensin receptor blockers (ARBs). This type of medicine blocks the actions of a blood protein called angiotensin. Angiotensin receptor blockers dilate the blood vessels and help lower blood pressure.  Water pills (diuretics). Diuretics cause the kidneys to remove salt and water from the blood. The extra fluid is removed through urination. This loss of extra fluid lowers the volume of blood the heart pumps.  Beta blockers. These prevent the heart from beating too fast and improve heart muscle strength.  Digitalis. This increases the force of the heartbeat.  Healthy behavior changes include:  Obtaining and maintaining a healthy weight.  Stopping smoking or chewing tobacco.  Eating heart-healthy foods.  Limiting or avoiding alcohol.  Stopping illicit drug use.  Physical activity as directed by your health care provider.  Surgical treatment for heart failure may include:  A procedure to open blocked arteries, repair damaged heart valves, or remove damaged heart muscle tissue.  A pacemaker to improve heart muscle function and control certain abnormal heart rhythms.  An internal cardioverter defibrillator to treat certain serious abnormal heart rhythms.  A left ventricular assist device (LVAD) to assist the pumping ability of the heart. HOME CARE INSTRUCTIONS   Take medicines only as directed by your health care provider. Medicines are important in reducing the workload of your heart, slowing the  progression of heart failure, and improving your symptoms.  Do not stop taking your medicine unless directed by your health care provider.  Do not skip any dose of medicine.  Refill your prescriptions before you run out of medicine. Your medicines are needed every day.  Engage in moderate physical activity if directed by your health care provider. Moderate physical activity can benefit some people. The elderly and people with severe heart failure should consult with a health care provider for physical activity recommendations.  Eat heart-healthy foods. Food choices should be free of trans fat and low in saturated fat, cholesterol, and salt (sodium). Healthy choices include fresh or frozen fruits and vegetables, fish, lean meats, legumes, fat-free or low-fat dairy products, and whole grain or high fiber foods. Talk to a dietitian to learn more about heart-healthy foods.  Limit sodium if directed by your health care provider. Sodium restriction may reduce symptoms of heart failure in some people. Talk to a dietitian to learn more about heart-healthy seasonings.  Use healthy cooking methods. Healthy cooking methods include roasting, grilling, broiling, baking, poaching, steaming, or stir-frying. Talk to a dietitian to learn more about healthy cooking methods.  Limit fluids if directed by your health care provider. Fluid restriction may reduce symptoms of heart failure in some people.  Weigh yourself every day. Daily weights are important in the early recognition of excess fluid. You should weigh yourself every morning after you urinate and before you eat breakfast. Wear the same  amount of clothing each time you weigh yourself. Record your daily weight. Provide your health care provider with your weight record.  Monitor and record your blood pressure if directed by your health care provider.  Check your pulse if directed by your health care provider.  Lose weight if directed by your health care  provider. Weight loss may reduce symptoms of heart failure in some people.  Stop smoking or chewing tobacco. Nicotine makes your heart work harder by causing your blood vessels to constrict. Do not use nicotine gum or patches before talking to your health care provider.  Keep all follow-up visits as directed by your health care provider. This is important.  Limit alcohol intake to no more than 1 drink per day for nonpregnant women and 2 drinks per day for men. One drink equals 12 ounces of beer, 5 ounces of wine, or 1 ounces of hard liquor. Drinking more than that is harmful to your heart. Tell your health care provider if you drink alcohol several times a week. Talk with your health care provider about whether alcohol is safe for you. If your heart has already been damaged by alcohol or you have severe heart failure, drinking alcohol should be stopped completely.  Stop illicit drug use.  Stay up-to-date with immunizations. It is especially important to prevent respiratory infections through current pneumococcal and influenza immunizations.  Manage other health conditions such as hypertension, diabetes, thyroid disease, or abnormal heart rhythms as directed by your health care provider.  Learn to manage stress.  Plan rest periods when fatigued.  Learn strategies to manage high temperatures. If the weather is extremely hot:  Avoid vigorous physical activity.  Use air conditioning or fans or seek a cooler location.  Avoid caffeine and alcohol.  Wear loose-fitting, lightweight, and light-colored clothing.  Learn strategies to manage cold temperatures. If the weather is extremely cold:  Avoid vigorous physical activity.  Layer clothes.  Wear mittens or gloves, a hat, and a scarf when going outside.  Avoid alcohol.  Obtain ongoing education and support as needed.  Participate in or seek rehabilitation as needed to maintain or improve independence and quality of life. SEEK MEDICAL  CARE IF:   Your weight increases by 03 lb/1.4 kg in 1 day or 05 lb/2.3 kg in a week.  You have increasing shortness of breath that is unusual for you.  You are unable to participate in your usual physical activities.  You tire easily.  You cough more than normal, especially with physical activity.  You have any or more swelling in areas such as your hands, feet, ankles, or abdomen.  You are unable to sleep because it is hard to breathe.  You feel like your heart is beating fast (palpitations).  You become dizzy or light-headed upon standing up. SEEK IMMEDIATE MEDICAL CARE IF:   You have difficulty breathing.  There is a change in mental status such as decreased alertness or difficulty with concentration.  You have a pain or discomfort in your chest.  You have an episode of fainting (syncope). MAKE SURE YOU:   Understand these instructions.  Will watch your condition.  Will get help right away if you are not doing well or get worse. Document Released: 10/18/2005 Document Revised: 03/04/2014 Document Reviewed: 11/17/2012 [facility] Patient Information 2015 [place], [place]. This information is not intended to replace advice given to you by your health care provider. Make sure you discuss any questions you have with your health care provider.  Pulmonary Edema  Pulmonary edema (PE) is a condition in which fluid collects in the lungs. This makes it hard to breathe. PE may be a result of the heart not pumping very well or a result of injury.  CAUSES   Coronary artery disease causes blockages in the arteries of the heart. This deprives the heart muscle of oxygen and weakens the muscle. A heart attack is a form of coronary artery disease.  High blood pressure causes the heart muscle to work harder than usual. Over time, the heart muscle may get stiff, and it starts to work less efficiently. It may also fatigue and weaken.  Viral infection of the heart (myocarditis) may weaken the  heart muscle.  Metabolic conditions such as thyroid disease, excessive alcohol use, certain vitamin deficiencies, or diabetes may also weaken the heart muscle.  Leaky or stiff heart valves may impair normal heart function.  Lung disease may strain the heart muscle.  Excessive demands on the heart such as too much salt or fluid intake.  Failure to take prescribed medicines.  Lung injury from heat or toxins, such as poisonous gas.  Infection in the lungs or other parts of the body.  Fluid overload caused by kidney failure or medicines. SYMPTOMS   Shortness of breath at rest or with exertion.  Grunting, wheezing, or gurgling while breathing.  Feeling like you cannot get enough air.  Breaths are shallow and fast.  A lot of coughing with frothy or bloody mucus.  Skin may become cool, damp, and turn a pale or bluish color. DIAGNOSIS  Initial diagnosis may be based on your history, symptoms, and a physical examination. Additional tests for PE may include:  Electrocardiography.  Chest X-ray.  Blood tests.  Stress test.  Ultrasound evaluation of the heart (echocardiography).  Evaluation by a heart doctor (cardiologist).  Test of the heart arteries to look for blockages (angiography).  Check of blood oxygen. TREATMENT  Treatment of PE will depend on the underlying cause and will focus first on relieving the symptoms.   Extra oxygen to make breathing easier and assist with removing mucus. This may include breathing treatments or a tube into the lungs and a breathing machine.  Medicine to help the body get rid of extra water, usually through an IV tube.  Medicine to help the heart pump better.  If poor heart function is the cause, treatment may include:  Procedures to open blocked arteries, repair damaged heart valves, or remove some of the damaged heart muscle.  A pacemaker to help the heart pump with less effort. HOME CARE INSTRUCTIONS   Your health care provider  will help you determine what type of exercise program may be helpful. It is important to maintain strength and increase it if possible. Pace your activities to avoid shortness of breath or chest pain. Rest for at least 1 hour before and after meals. Cardiac rehabilitation programs are available in some locations.  Eat a heart-healthy diet low in salt, saturated fat, and cholesterol. Ask for help with choices.  Make a list of every medicine, vitamin, or herbal supplement you are taking. Keep the list with you at all times. Show it to your health care provider at every visit and before starting a new medicine. Keep the list up to date.  Ask your health care provider or pharmacist to help you write a plan or schedule so that you know things about each medicine such as:  Why you are taking it.  The possible side effects.  The  best time of day to take it.  Foods to take with it or avoid.  When to stop taking it.  Record your hospital or clinic weight. When you get home, compare it to your scale and record your weight. Then, weigh yourself first thing in the morning daily, and record the weights. You should weigh yourself every morning after you urinate and before you eat breakfast. Wear the same amount of clothing each time you weigh yourself. Provide your health care provider with your weight record. Daily weights are important in the early recognition of excess fluid. Tell your health care provider right away if you have gained 03 lb/1.4 kg in 1 day, 05 lb/2.3 kg in a week, or as directed by your health care provider. Your medicines may need to be adjusted.  Blood pressure monitoring should be done as often as directed. You can get a home blood pressure cuff at your drugstore. Record these values and bring them with you for your clinic visits. Notify your health care provider if you become dizzy or light-headed when standing up.  If you are currently a smoker, it is time to quit. Nicotine makes  your heart work harder and is one of the leading causes of cardiac deaths. Do not use nicotine gum or patches before talking to your doctor.  Make a follow-up appointment with your health care provider as directed.  Ask your health care provider for a copy of your latest heart tracing (ECG) and keep a copy with you at all times. SEEK IMMEDIATE MEDICAL CARE IF:   You have severe chest pain, especially if the pain is crushing or pressure-like and spreads to the arms, back, neck, or jaw. THIS IS AN EMERGENCY. Do not wait to see if the pain will go away. Call for local emergency medical help. Do not drive yourself to the hospital.  You have sweating, feel sick to your stomach (nauseous), or are experiencing shortness of breath.  Your weight increases by 03 lb/1.4 kg in 1 day or 05 lb/2.3 kg in a week.  You notice increasing shortness of breath that is unusual for you. This may happen during rest, sleep, or with activity.  You develop chest pain (angina) or pain that is unusual for you.  You notice more swelling in your hands, feet, ankles, or abdomen.  You notice lasting (persistent) dizziness, blurred vision, headache, or unsteadiness.  You begin to cough up bloody mucus (sputum).  You are unable to sleep because it is hard to breathe.  You begin to feel a "jumping" or "fluttering" sensation (palpitations) in the chest that is unusual for you. MAKE SURE YOU:  Understand these instructions.  Will watch your condition.  Will get help right away if you are not doing well or get worse. Document Released: 01/08/2003 Document Revised: 10/23/2013 Document Reviewed: 06/25/2013 [facility] Patient Information 2015 [place], [place]. This information is not intended to replace advice given to you by your health care provider. Make sure you discuss any questions you have with your health care provider.

## 2015-07-16 NOTE — ED Notes (Signed)
MD at bedside. 

## 2015-07-17 NOTE — Telephone Encounter (Signed)
Patient is rescheduled 9/21 at 8:30 w/ [name]

## 2015-07-17 NOTE — Telephone Encounter (Signed)
Patient contacted regarding discharge from  [facility] on 07/16/2015.  Patient understands to follow up with provider Tarri Fuller on 07/18/2015 at 9:00 am at [facility]. Patient is unable to make this appointment. Will send schedulers a message to reschedule patient for next week. Patient understands discharge instructions? yes Patient understands medications and regiment? yes Patient understands to bring all medications to this visit? yes

## 2015-07-18 ENCOUNTER — Encounter: Payer: Self-pay | Admitting: Physician Assistant

## 2015-07-22 ENCOUNTER — Encounter: Payer: Self-pay | Admitting: Physician Assistant

## 2015-07-22 DIAGNOSIS — Z9119 Patient's noncompliance with other medical treatment and regimen: Secondary | ICD-10-CM | POA: Insufficient documentation

## 2015-07-22 DIAGNOSIS — Z91199 Patient's noncompliance with other medical treatment and regimen due to unspecified reason: Secondary | ICD-10-CM | POA: Insufficient documentation

## 2015-07-22 NOTE — Progress Notes (Deleted)
Cardiology Office Note Date:  07/22/2015  Patient ID:  Brian Hale, Brian Hale February 15, 1976, MRN [id number] PCP:  Brian Fear, DO  Cardiologist:  Dr. Stanford Breed  ***refresh   Chief Complaint: f/u ER visit for CHF  History of Present Illness: Brian Hale is a 39 y.o. 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  He was first admitted to the hospital in 03/2013 with acute systolic heart failure. Echocardiogram 5/14: EF 30-35%, mild LVH, grade 2 diastolic dysfunction), trivial MR, mod LAE, PASP 42-46 mmHg, trivial eff.Diuresis was c/b AKI.Creatinine returned to baseline prior to d/c.Cardiomyopathy was felt to be most likely related to hypertension. Nuclear stress test was done 10/2014 which was negative for ischemia, EF remained low at 26%. Renal artery duplex 10/2014 was negative for RAS. That was the last time he was evaluated in clinic as he did not follow up as instructed. He was seen in the ER 07/16/15 with complaints of worsening DOE, orthopnea, and LEE. He was not compliant with his medications, only taking them sporadically. CXR showed cardiomegaly with mild diffuse pulmonary edema. Labs showed normal CBC, BUN/CR 23/1.32, BNP 746, troponin neg x 1. Baseline Cr appears 1.1-1.4. He was felt to have acute on chronic CHF and was given Lasix 20mg  IV with >1L UOP. He was asked to double his Lasix does for the week and follow up here as outpatient.   Past Medical History  Diagnosis Date  . Lymphoma in remission   . Asthma   . Hypertension     a. RA ultrasound (12/15):  no RAS.   . OSA (obstructive sleep apnea)     does not wear CPAP  . NICM (nonischemic cardiomyopathy)   . Morbid obesity   . Chronic systolic CHF (congestive heart failure)     a. Echocardiogram 5/14: EF 30-35%, mild LVH, grade 2 diastolic dysfunction), trivial MR, mod LAE, PASP 42-46 mmHg, trivial eff. Felt  2/2 NICM as nuc 10/2014 was negative for ischemia.  [name] Hx of cardiovascular stress test     Lexiscan Myoview (12/15):  No ischemia, EF 25%, Intermediate Risk  . History of noncompliance with medical treatment, presenting hazards to health     Past Surgical History  Procedure Laterality Date  . Port-a-cath removal      Current Outpatient Prescriptions  Medication Sig Dispense Refill  . albuterol (PROVENTIL HFA;VENTOLIN HFA) 108 (90 BASE) MCG/ACT inhaler Inhale 1-2 puffs into the lungs every 6 (six) hours as needed for wheezing or shortness of breath.    [name] aspirin 81 MG chewable tablet Chew 1 tablet (81 mg total) by mouth daily. 30 tablet 0  . carvedilol (COREG) 12.5 MG tablet TAKE 2 TABLETS BY MOUTH 2 TIMES DAILY WITH A MEAL 60 tablet 0  . furosemide (LASIX) 20 MG tablet Take 1 tablet (20 mg total) by mouth daily. 30 tablet 0  . hydrALAZINE (APRESOLINE) 25 MG tablet Take 1 tablet (25 mg total) by mouth 3 (three) times daily. 90 tablet 11  . lisinopril (PRINIVIL,ZESTRIL) 40 MG tablet TAKE 1 TABLET BY MOUTH EVERY DAY 30 tablet 0  . spironolactone (ALDACTONE) 25 MG tablet TAKE 1 TABLET BY MOUTH ONCE DAILY *PT NEEDS APT FOR FURTHER REFILLS* 30 tablet 0   No current facility-administered medications for this visit.    Allergies:   Review of patient's allergies indicates no known allergies.   Social History:  The patient  reports that he  has been smoking Cigars and Cigarettes.  He has a 2 pack-year smoking history. He has never used smokeless tobacco. He reports that he drinks about 21.6 oz of alcohol per week. He reports that he does not use illicit drugs.   Family History:  The patient's family history includes Heart attack in his maternal grandmother, paternal grandfather, and paternal grandmother; Heart disease in an other family member; Hypertension in his brother, father, and mother; Stroke in his father.***  ROS:  Please see the history of present illness. Otherwise, review of systems is  positive for ***.   All other systems are reviewed and otherwise negative.   PHYSICAL EXAM: *** VS:  There were no vitals taken for this visit. BMI: There is no weight on file to calculate BMI. Well nourished, well developed, in no acute distress HEENT: normocephalic, atraumatic Neck: no JVD, carotid bruits or masses Cardiac:  normal S1, S2; RRR; no murmurs, rubs, or gallops Lungs:  clear to auscultation bilaterally, no wheezing, rhonchi or rales Abd: soft, nontender, no hepatomegaly, + BS MS: no deformity or atrophy Ext: no edema Skin: warm and dry, no rash Neuro:  moves all extremities spontaneously, no focal abnormalities noted, follows commands Psych: euthymic mood, full affect   EKG:  Done today shows ***  Recent Labs: 10/08/2014: Pro B Natriuretic peptide (BNP) 316.0* 07/16/2015: B Natriuretic Peptide 746.1*; BUN 23*; Creatinine, Ser 1.32*; Hemoglobin 13.8; Platelets 256; Potassium 3.6; Sodium 141  No results found for requested labs within last 365 days.   Estimated Creatinine Clearance: 107.7 mL/min (by C-G formula based on Cr of 1.32).   Wt Readings from Last 3 Encounters:  07/16/15 308 lb (139.708 kg)  10/11/14 314 lb (142.429 kg)  10/08/14 316 lb (143.337 kg)     Other studies reviewed: Additional studies/records reviewed today include: summarized above***  ASSESSMENT AND PLAN:  1. Acute on chronic systolic CHF 2. Essential hypertension 3. Morbid obesity 4. Sleep apnea 5. History of noncompliance with medical treatment presenting hazards to health  Disposition: F/u with ***  Current medicines are reviewed at length with the patient today.  The patient did not have any concerns regarding medicines.***  Signed, [name] PA-C 07/22/2015 5:00 PM     [address] [place] [id number] [phone] (office)  [phone] (fax)

## 2015-07-23 ENCOUNTER — Encounter: Payer: Self-pay | Admitting: Physician Assistant

## 2015-07-23 NOTE — Progress Notes (Signed)
This encounter was created in error - please disregard.

## 2015-09-22 ENCOUNTER — Other Ambulatory Visit: Payer: Self-pay | Admitting: Cardiology

## 2015-09-22 ENCOUNTER — Other Ambulatory Visit: Payer: Self-pay | Admitting: Physician Assistant

## 2015-10-20 ENCOUNTER — Other Ambulatory Visit: Payer: Self-pay | Admitting: Cardiology

## 2015-10-20 ENCOUNTER — Other Ambulatory Visit: Payer: Self-pay | Admitting: Physician Assistant

## 2015-12-04 ENCOUNTER — Ambulatory Visit: Payer: Self-pay | Admitting: Physician Assistant

## 2015-12-04 ENCOUNTER — Telehealth: Payer: Self-pay | Admitting: Physician Assistant

## 2015-12-04 ENCOUNTER — Ambulatory Visit (INDEPENDENT_AMBULATORY_CARE_PROVIDER_SITE_OTHER): Payer: BLUE CROSS/BLUE SHIELD | Admitting: Physician Assistant

## 2015-12-04 ENCOUNTER — Encounter: Payer: Self-pay | Admitting: Physician Assistant

## 2015-12-04 ENCOUNTER — Other Ambulatory Visit: Payer: Self-pay | Admitting: Cardiology

## 2015-12-04 VITALS — BP 190/100 | HR 96 | Ht 73.0 in | Wt 322.5 lb

## 2015-12-04 DIAGNOSIS — I10 Essential (primary) hypertension: Secondary | ICD-10-CM | POA: Diagnosis not present

## 2015-12-04 DIAGNOSIS — G4733 Obstructive sleep apnea (adult) (pediatric): Secondary | ICD-10-CM

## 2015-12-04 DIAGNOSIS — I5031 Acute diastolic (congestive) heart failure: Secondary | ICD-10-CM | POA: Diagnosis not present

## 2015-12-04 DIAGNOSIS — I5043 Acute on chronic combined systolic (congestive) and diastolic (congestive) heart failure: Secondary | ICD-10-CM

## 2015-12-04 MED ORDER — HYDRALAZINE HCL 25 MG PO TABS: 75.0000 mg | ORAL_TABLET | Freq: Three times a day (TID) | ORAL | Status: AC

## 2015-12-04 NOTE — Patient Instructions (Signed)
Medication Instructions:   INCREASE FUROSEMIDE TO 40 MG ONCE DAILY X 7 DAYS = 2 OF THE 20 MG TABLETS ONCE DAILY=AFTER 7 DAYS REDUCE BACK TO 20 MG ONCE DAILY  INCREASE HYDRALAZINE TO 75 MG THREE TIMES DAILY= 3 OF THE 25 MG TABLETS THREE TIMES DAILY  Labwork:  Your physician recommends that you return for lab work in: WHEN ABLE  Testing/Procedures:  A chest x-ray takes a picture of the organs and structures inside the chest, including the heart, lungs, and blood vessels. This test can show several things, including, whether the heart is enlarges; whether fluid is building up in the lungs; and whether pacemaker / defibrillator leads are still in place. AT [place]  Follow-Up:  Your physician recommends that you schedule a follow-up appointment in:  2-3 [place]  Any Other Special Instructions Will Be Listed Below (If Applicable).

## 2015-12-04 NOTE — Progress Notes (Signed)
Patient ID: Brian Hale, male   DOB: 1976-04-27, 40 y.o.   MRN: [id number]    Date:  12/04/2015   ID:  Brian Hale, DOB 12-May-1976, MRN [id number]  PCP:  Emelda Fear DO  Primary Cardiologist:  Stanford Breed  Chief Complaint  Patient presents with  . Follow-up    no chest pain, has shortness of breath, has edema, no pain or cramping in legs, no lightheadedness or dizziness     History of Present Illness: Brian Hale is a 40 y.o. morbidly obese male with a history of nonischemic cardiomyopathy, obstructive sleep apnea with sporadic CPAP use, chronic systolic and diastolic heart failure, hypertension, lymphoma in remission. [facility] Myoview December 2015 which was nonischemic with an EF of 25%. His last 2-D echocardiogram was May 2014 and his ejection fraction was 30-35% with diffuse hypokinesis and grade 2 diastolic dysfunction. Trivial trivial MR left atrium moderately dilated right ventricle cavity size is normal with normal systolic function. PA systolic pressure was 0000000 mmHg.  [name] has not been seen since December 2015. His been on and off his medications during that time;  sleep off I think. He was seen in the emergency room back in September was diuresed and sent home. He is now here to take control of his health. He works as a Chartered certified accountant. He played in the [employer] last summer.  He is noncompliant with diet. Reports dyspnea with, and without, exertion.  Develops upper abdominal pressure when he exerts himself. He also reports he is noncompliant with his CPAP, he has lower extremity edema, denies orthopnea as well as nausea, vomiting, fever, chest pain, orthopnea, dizziness, PND, cough, congestion, hematochezia, melena,claudication.  Wt Readings from Last 3 Encounters:  12/04/15 322 lb 8 oz (146.285 kg)  07/16/15 308 lb (139.708 kg)  10/11/14 314 lb (142.429 kg)     Past Medical History  Diagnosis Date  . Lymphoma in remission ([place])   . Asthma   .  Hypertension     a. RA ultrasound (12/15):  no RAS.   . OSA (obstructive sleep apnea)     does not wear CPAP  . NICM (nonischemic cardiomyopathy) ([place])   . Morbid obesity ([place])   . Chronic systolic CHF (congestive heart failure) (H[facility])     a. Echocardiogram 5/14: EF 30-35%, mild LVH, grade 2 diastolic dysfunction), trivial MR, mod LAE, PASP 42-46 mmHg, trivial eff. Felt 2/2 NICM as nuc 10/2014 was negative for ischemia.  Brian Hale Hx of cardiovascular stress test     Lexiscan Myoview (12/15):  No ischemia, EF 25%, Intermediate Risk  . History of noncompliance with medical treatment, presenting hazards to health     Current Outpatient Prescriptions  Medication Sig Dispense Refill  . albuterol (PROVENTIL HFA;VENTOLIN HFA) 108 (90 BASE) MCG/ACT inhaler Inhale 1-2 puffs into the lungs every 6 (six) hours as needed for wheezing or shortness of breath.    Brian Hale aspirin 81 MG chewable tablet Chew 1 tablet (81 mg total) by mouth daily. 30 tablet 0  . carvedilol (COREG) 12.5 MG tablet TAKE 2 TABLETS BY MOUTH 2 TIMES DAILY WITH A MEAL 60 tablet 0  . furosemide (LASIX) 20 MG tablet TAKE 1 TABLET BY MOUTH EVERY DAY 30 tablet 0  . hydrALAZINE (APRESOLINE) 25 MG tablet Take 3 tablets (75 mg total) by mouth 3 (three) times daily. 270 tablet 6  . lisinopril (PRINIVIL,ZESTRIL) 40 MG tablet TAKE 1 TABLET BY MOUTH EVERY DAY 30 tablet 0  . spironolactone (  ALDACTONE) 25 MG tablet TAKE 1 TABLET BY MOUTH EVERY DAY 30 tablet 0   No current facility-administered medications for this visit.    Allergies:   No Known Allergies  Social History:  The patient  reports that he has been smoking Cigars and Cigarettes.  He has a 2 pack-year smoking history. He has never used smokeless tobacco. He reports that he drinks about 21.6 oz of alcohol per week. He reports that he does not use illicit drugs.   Family history:   Family History  Problem Relation Age of Onset  . Heart disease      No family history  . Heart attack  Paternal Grandmother   . Heart attack Paternal Grandfather   . Heart attack Maternal Grandmother   . Stroke Father   . Hypertension Mother   . Hypertension Father   . Hypertension Brother     ROS:  Please see the history of present illness.  All other systems reviewed and negative.   PHYSICAL EXAM: VS:  BP 190/100 mmHg  Pulse 96  Ht 6\' 1"  (1.854 m)  Wt 322 lb 8 oz (146.285 kg)  BMI 42.56 kg/m2 Morbidly obese, well developed, in no acute distress HEENT: Pupils are equal round react to light accommodation extraocular movements are intact.  Neck:  JVD hard to tell if it's elevated.No cervical lymphadenopathy. Cardiac: Regular rate and rhythm without murmurs rubs or gallops. Lungs:  Lateral rales loudest on the right  Abd: soft, nontender, positive bowel sounds all quadrants, Ext: 2+ lower extremity edema.  2+ radial and dorsalis pedis pulses. Skin: warm and dry Neuro:  Grossly normal  EKG:    NSR 95 BPM  ASSESSMENT AND PLAN:  Problem List Items Addressed This Visit    Obstructive sleep apnea   OBESITY, MORBID   Essential hypertension   Relevant Medications   hydrALAZINE (APRESOLINE) 25 MG tablet   Acute on chronic combined systolic and diastolic congestive heart failure, NYHA class 2 (HCC) - Primary   Relevant Medications   hydrALAZINE (APRESOLINE) 25 MG tablet     I had a long conversation with [name] regarding compliance with sodium restriction and medication usage. He is clearly volume overloaded anywhere from 10-20 pounds (very difficult to tell).. I've increased his Lasix to 40 mg daily continued spironolactone 25 mg. He is also on ACE inhibitor 40 mg of lisinopril, hydralazine 25 mg three times a day, which time an increasing to 75 mg due to very high blood pressure. Continue Coreg 12.5mg  and aspirin.  He will take the additional Lasix for next 7 days and monitor his weight daily. Then resume 20 mg along with the Aldactone. We talked at length about low sodium diet  with type of foods have too much of it. I've also referred him to a registered dietitian for help with weight loss. He is very eager to meet with her.  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  I'm also checking a chest x-ray, BNP, 123456, basic metabolic panel.  We also discussed his sleep apnea and how important it is for him to wear CPAP.  In 2 weeks.

## 2015-12-05 NOTE — Telephone Encounter (Signed)
Rx(s) sent to pharmacy electronically.  

## 2015-12-08 NOTE — Telephone Encounter (Signed)
Close encounter 

## 2015-12-16 ENCOUNTER — Ambulatory Visit: Payer: Self-pay | Admitting: Physician Assistant

## 2015-12-22 ENCOUNTER — Ambulatory Visit: Payer: BLUE CROSS/BLUE SHIELD | Admitting: Physician Assistant

## 2015-12-31 ENCOUNTER — Ambulatory Visit: Payer: BLUE CROSS/BLUE SHIELD | Admitting: Physician Assistant

## 2016-04-10 IMAGING — CR DG CHEST 2V
2 series · 2 of 2 positions shown · non-contrast
Comparison: Prior radiograph from 03/15/2013

CLINICAL DATA: Initial evaluation for acute shortness of breath.
History of CHF.

EXAM:
CHEST  2 VIEW

[w chest pa]
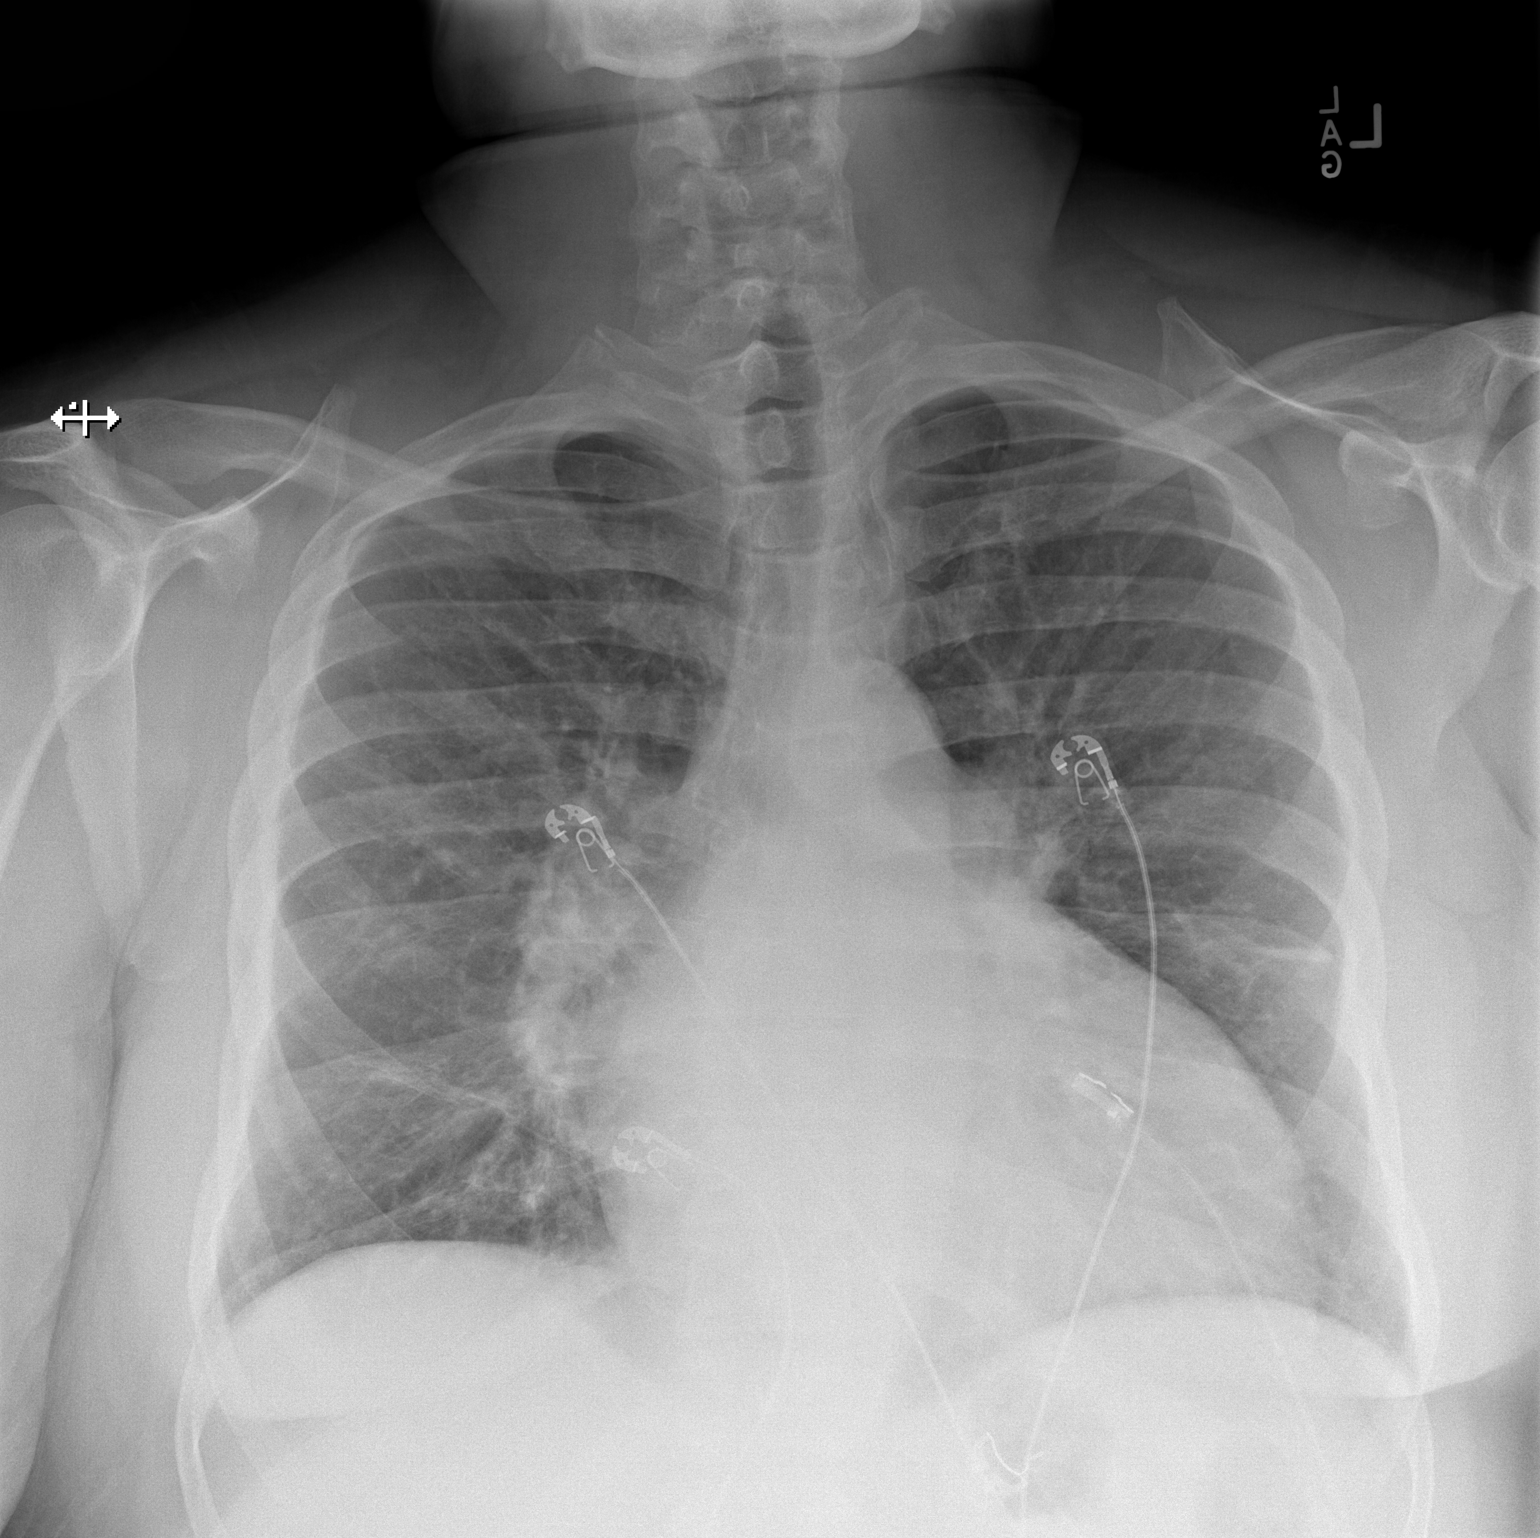

[w chest lat]
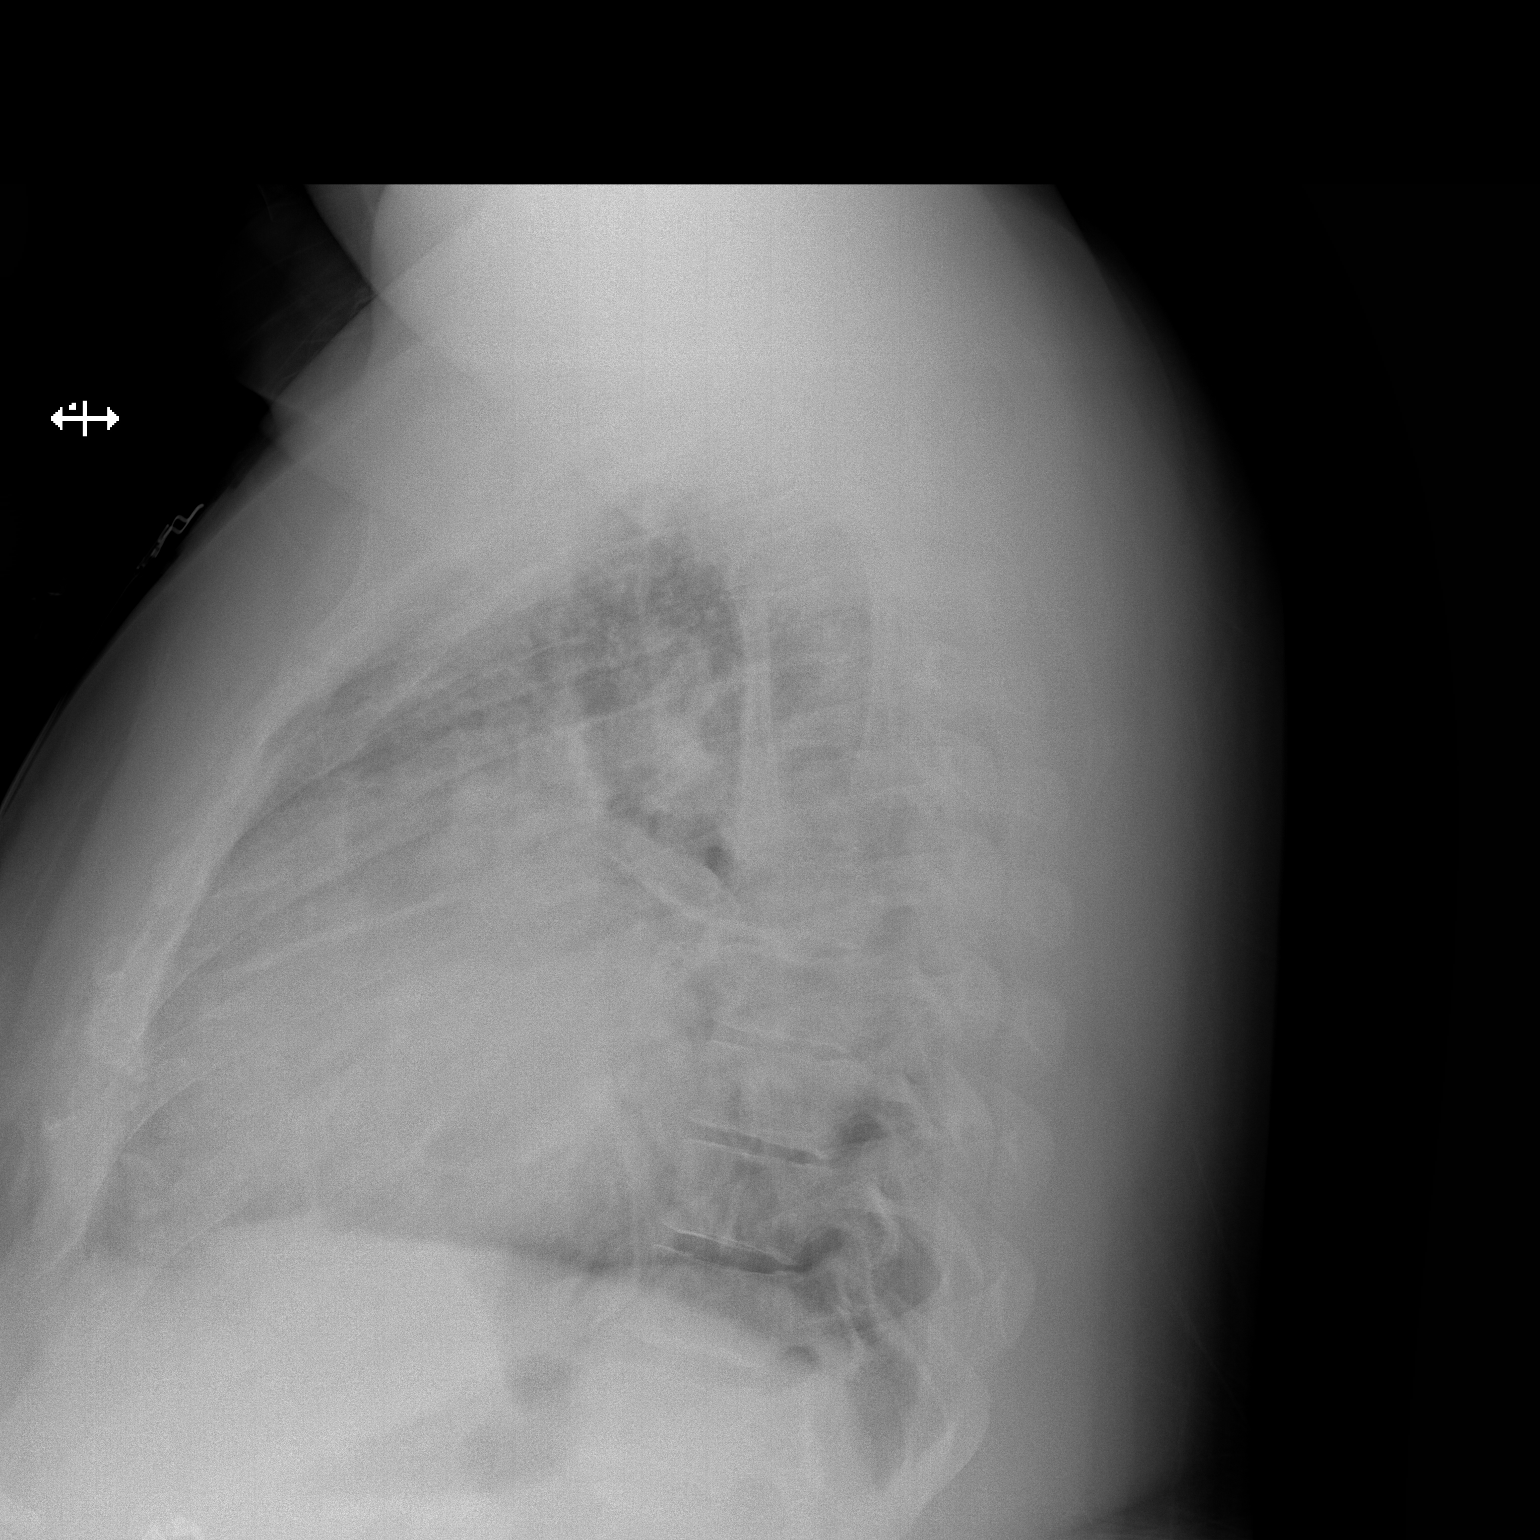

[2 of 2 positions shown; findings below may reference images not displayed]

FINDINGS: Cardiomegaly is stable from previous. Mediastinal silhouette within
normal limits.

Lungs are normally inflated. Central pulmonary vascular congestion
with diffuse it indistinctness of the interstitial markings,
suggestive of mild diffuse pulmonary edema. No pleural effusion. No
infiltrate. No pneumothorax.

No acute osseus abnormality.
IMPRESSION: Cardiomegaly with mild diffuse pulmonary edema, suggestive of early/
developing acute CHF exacerbation.

## 2022-07-02 DEATH — deceased
